# Patient Record
Sex: Female | Born: 2000 | Race: White | Hispanic: No | Marital: Single | Smoking: Never smoker
Health system: Southern US, Community
[De-identification: ages and names within clinical notes are randomized; demographics above are authoritative.]

## PROBLEM LIST (undated history)

## (undated) DIAGNOSIS — F419 Anxiety disorder, unspecified: Secondary | ICD-10-CM

## (undated) DIAGNOSIS — K589 Irritable bowel syndrome without diarrhea: Secondary | ICD-10-CM

## (undated) HISTORY — DX: Irritable bowel syndrome, unspecified: K58.9

## (undated) HISTORY — DX: Anxiety disorder, unspecified: F41.9

---
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
## 2018-02-06 ENCOUNTER — Ambulatory Visit: Payer: BLUE CROSS/BLUE SHIELD | Attending: Obstetrics & Gynecology

## 2018-02-06 DIAGNOSIS — M2170 Unequal limb length (acquired), unspecified site: Secondary | ICD-10-CM | POA: Diagnosis present

## 2018-02-06 DIAGNOSIS — M533 Sacrococcygeal disorders, not elsewhere classified: Secondary | ICD-10-CM | POA: Diagnosis present

## 2018-02-06 DIAGNOSIS — M791 Myalgia, unspecified site: Secondary | ICD-10-CM | POA: Insufficient documentation

## 2018-02-06 DIAGNOSIS — R293 Abnormal posture: Secondary | ICD-10-CM | POA: Diagnosis present

## 2018-02-06 NOTE — Therapy (Signed)
[facility] MAIN [facility] SERVICES [address] [place], [place], [address] Phone: [phone]   Fax:  [phone]  Physical Therapy Evaluation  Patient Details  Name: Judy Williamson MRN: [id number] Date of Birth: May 31, 2000 Referring Provider (PT): Magdy Milad   Encounter Date: 02/06/2018  PT End of Session - 02/09/18 0826    Visit Number  1    Number of Visits  10    Date for PT Re-Evaluation  04/20/18    PT Start Time  1630    PT Stop Time  1730    PT Time Calculation (min)  60 min    Activity Tolerance  Patient tolerated treatment well    Behavior During Therapy  [facility] for tasks assessed/performed       Past Medical History:  Diagnosis Date  . Anxiety     Past Surgical History:  Procedure Laterality Date  . HYMENECTOMY  11/2016    There were no vitals filed for this visit.     Pelvic Floor Physical Therapy Evaluation and Assessment  SCREENING  Falls in last 6 mo: no    Patient's communication preference:   Red Flags:  Have you had any night sweats? Yes but it may be her new housing. Unexplained weight loss? no Saddle anesthesia? no Unexplained changes in bowel or bladder habits? no  SUBJECTIVE  Patient reports: Things are going well, she is still dilating but refuses to use the biggest size. Still has pain with the 7th one but is using the 6th and 7th one.   Social/Family/Vocational History:   Full time student  Recent Procedures/Tests/Findings:  none  Obstetrical History: none  Gynecological History: none  Urinary History: Not having frequency  Gastrointestinal History: Having diarrhea close to daily from use of linzess and coffee, etc.  Sexual activity/pain: She is having pain with intercourse, both with initial penetration and deep thrusting.   Location of pain: vaginally with intercourse Current pain:  0/10  Max pain:  8/10 Least pain:  0/10 Nature of pain: achy  Patient Goals: To be able  to have intercourse without pain.    OBJECTIVE  Posture/Observations:  Sitting:  Standing:  R PSIS and ASIS high in standing  Palpation/Segmental Motion/Joint Play: Decreased sacral mobility on L>R and through the lumbar spine. TTP through B lumbar paraspinals and R>L QL, B hip-flexors and proximal adductiors.  Special tests:   Leg-length: R LE 1 cm long Supine-to-sit: R long in both   Range of Motion/Flexibilty: Deferred to next visit Spine: Hips:   Strength/MMT: deferred to next visit. LE MMT  LE MMT Left Right  Hip flex:  (L2) /5 /5  Hip ext: /5 /5  Hip abd: /5 /5  Hip add: /5 /5  Hip IR /5 /5  Hip ER /5 /5     Abdominal:  Palpation: TTP through B Iliacus and Psoas Diastasis: none  Pelvic Floor External Exam: Deferred to next visit. Introitus Appears:  Skin integrity:  Palpation: Cough: Prolapse visible?: Scar mobility:  Internal Vaginal Exam: Deferred to next visit Strength (PERF):  Symmetry: Palpation: Prolapse:   Gait Analysis: Pt. Is not dragging her feet like she did during prior therapy bout. Is implementing recommendations    Pelvic Floor Outcome Measures:   INTERVENTIONS THIS SESSION: Manual: Performed TP release to R psoas, QL, and lumbar paraspinals followed by R up-slip correction to improve pelvic alignment and decrease pressure on lumbosacral nerve roots. Dry-needle: Performed TPDN with a .30x13mm needle and standard approach to  R QL and lumbar paraspinals to decrease pain and spasm and allow for successful up-slip correction to improve pelvic alignment. Therex: Educated on and practiced frog stretch and side-stretch to decrease imbalance of musculature surrounding the pelvis to allow for relaxation of PFM.  Total time: 60 min.     [facility] PT Assessment - 02/09/18 0001      Assessment   Medical Diagnosis  Pelvic Floor Dysfunction    Referring Provider (PT)  Magdy Milad    Prior Therapy  Yes      Precautions   Precautions  None       Restrictions   Weight Bearing Restrictions  No      Balance Screen   Has the patient fallen in the past 6 months  No      Home Environment   Living Environment  Other (Comment)    Additional Comments  --   Dorm     Prior Function   Level of Independence  Independent    Vocation  Student      Cognition   Overall Cognitive Status  Within Functional Limits for tasks assessed                Objective measurements completed on examination: See above findings.              PT Education - 02/09/18 0825    Education provided  Yes    Education Details  See Pt. Instructions and Interventions this session    Person(s) Educated  Patient    Methods  Explanation;Verbal cues    Comprehension  Verbalized understanding;Returned demonstration       PT Short Term Goals - 02/09/18 0835      PT SHORT TERM GOAL #1   Title  Patient will demonstrate a coordinated contraction, relaxation, and bulge of the pelvic floor muscles to demonstrate functional recruitment and motion and allow for further strengthening.    Baseline  high tone and spasms not allowing appropriate bulge of PFM on cue    Time  5    Period  Weeks    Status  New    Target Date  03/16/18      PT SHORT TERM GOAL #2   Title  Pt. will demonstrate ability to have regular BM's at least every 2-3 days without use of linzess or with decreased dose as reccommended by MD.    Baseline  chronic constipation now consistently diarrhea with medication use    Time  5    Period  Weeks    Status  New    Target Date  03/16/18        PT Long Term Goals - 02/09/18 0841      PT LONG TERM GOAL #1   Title  Patient will report no pain with intercourse to demonstrate improved functional ability.    Baseline  Pain greater with digital than with penile penetration but present with moth.    Time  10    Period  Weeks    Status  New    Target Date  04/20/18      PT LONG TERM GOAL #2   Title  Patient will report  having BM's at least every-other day with consistency between [facility] stool scale 3-5 over the prior week without use of medication to regulate to demonstrate decreased constipation.    Time  10    Period  Weeks    Status  New    Target Date  04/20/18  Plan - 02/09/18 0826    Clinical Impression Statement  Pt. is a 17 y/o female who presents today with cheif c/o pain with intercourse. She was previously recieving therapy for pelvic pain, low back pain and inability to use a tampon, all of which have maintained improvement but she continues to have pain with digital penetration and intercourse. Her relevant PMH includes a hymnectomy, IBS, and anxiety. Cher clinical exam revealed minor R innominate up-slip and decreased mobility, spasms through B lumbar paraspinals and R QL and decreased mobility of R>L sacral borders. She will benefif from skilled pelvic PT to help her continue to decrease pelvic floor spasms and increase postural strength to prevent return of spasms and allow for vaginal penetration without pain as well as to help Pt. maintain normal bowel function as she weans off of linzess to decrease reliance on medication for bowel function.     History and Personal Factors relevant to plan of care:  High anxiety, IBS    Clinical Presentation  Stable    Clinical Decision Making  Moderate    Rehab Potential  Good    PT Frequency  1x / week    PT Duration  --   10 weeks   PT Treatment/Interventions  Therapeutic activities;Functional mobility training;Neuromuscular re-education;Therapeutic exercise;Patient/family education;Moist Heat;Manual lymph drainage;Manual techniques;Scar mobilization    PT Next Visit Plan  Vaginal assessment and TP release, rebuild HEP in new application    PT Home Exercise Plan   foam roll to adductors and quads, hip flexor stretch, diaphragmatic breathing, TA mini-marches, chin-tucks, scapular retraction/depression,thoracic extension over foam  roller/towel, tall kneeling, forward stepping balance, froggy stretch hold/relax stretch, foam roller thoracic mobility sequence, self internal TP release and posterior fourchette massage, side-stepping.    Consulted and Agree with Plan of Care  Patient       Patient will benefit from skilled therapeutic intervention in order to improve the following deficits and impairments:  Decreased activity tolerance, Decreased coordination, Decreased safety awareness, Decreased strength, Increased fascial restricitons, Postural dysfunction, Pain, Improper body mechanics, Decreased range of motion, Decreased scar mobility, Decreased endurance, Decreased balance, Decreased mobility, Increased muscle spasms, Hypomobility, Difficulty walking  Visit Diagnosis: Myalgia  Sacrococcygeal disorders, not elsewhere classified  Leg length discrepancy  Abnormal posture     Problem List Patient Active Problem List   Diagnosis Date Noted  . Anxiety 02/21/2017   Cleophus Molt DPT, ATC Cleophus Molt 02/09/2018, 8:59 AM  [facility] MAIN [facility] SERVICES [address] [place], [place], [address] Phone: [phone]   Fax:  [phone]  Name: Shakinah Navis MRN: [id number] Date of Birth: Dec 17, 2000

## 2018-02-07 NOTE — Patient Instructions (Signed)
Access Code: [email or web address]  URL: [email or web address]  Date: 02/06/2018  Prepared by: Flora Lipps   Exercises  Seated Alternating Side Stretch with Arm Overhead - 3 reps - 5 breaths hold - 1x daily - 7x weekly  Quadruped Adductor Stretch - 3 reps - 5 breaths hold - 1x daily - 7x weekly

## 2018-02-09 ENCOUNTER — Other Ambulatory Visit: Payer: Self-pay

## 2018-02-21 ENCOUNTER — Ambulatory Visit: Payer: BLUE CROSS/BLUE SHIELD | Attending: Obstetrics & Gynecology

## 2018-02-21 DIAGNOSIS — M533 Sacrococcygeal disorders, not elsewhere classified: Secondary | ICD-10-CM | POA: Insufficient documentation

## 2018-02-21 DIAGNOSIS — M2170 Unequal limb length (acquired), unspecified site: Secondary | ICD-10-CM | POA: Insufficient documentation

## 2018-02-21 DIAGNOSIS — R293 Abnormal posture: Secondary | ICD-10-CM | POA: Insufficient documentation

## 2018-02-21 DIAGNOSIS — M791 Myalgia, unspecified site: Secondary | ICD-10-CM | POA: Diagnosis present

## 2018-02-21 NOTE — Therapy (Signed)
[facility] MAIN [facility] SERVICES [address] [place], [place], [address] Phone: [phone]   Fax:  [phone]  Physical Therapy Treatment  Patient Details  Name: Judy Williamson MRN: [id number] Date of Birth: [birth year] Referring Provider (PT): Magdy Milad   Encounter Date: 02/21/2018  PT End of Session - 02/22/18 1637    Visit Number  2    Number of Visits  10    Date for PT Re-Evaluation  04/20/18    PT Start Time  1630    PT Stop Time  1735    PT Time Calculation (min)  65 min    Activity Tolerance  Patient tolerated treatment well    Behavior During Therapy  [facility] for tasks assessed/performed       Past Medical History:  Diagnosis Date  . Anxiety     Past Surgical History:  Procedure Laterality Date  . HYMENECTOMY  11/2016    There were no vitals filed for this visit.    Pelvic Floor Physical Therapy Treatment Note  SCREENING  Changes in medications, allergies, or medical history?: no    SUBJECTIVE  Patient reports: Has only done her stretches/exercises once since last visit but has no pain today.   Pain update: No pain since last visit.  Location of pain: vaginally with intercourse Current pain: 0/10  Max pain: 8/10 Least pain: 0/10 Nature of pain:achy  Patient Goals: To be able to have intercourse without pain.   OBJECTIVE  Changes in: Posture/Observations:  PSIS and ASIS are aligned at beginning of session.   Pelvic floor: TTP through all muscles on the R with resolution and to posterior PR/PC on L   INTERVENTIONS THIS SESSION: Manual: performed internal TP release to all muscles on the R with resolution and to posterior PR/PC on L with > 50% improvement to decrease pain and spasm with vaginal penetration. Self-care: recommended to Pt. That she contact her GI doctor to trial decrease  In linzess due to her consistent diarrhea and to see how her body has improved in its ability to empty from  therapy.   Total time: 60 min.                          PT Education - 02/22/18 1637    Education provided  Yes    Education Details  See Interventions this session    Person(s) Educated  Patient    Methods  Explanation    Comprehension  Verbalized understanding       PT Short Term Goals - 02/09/18 0835      PT SHORT TERM GOAL #1   Title  Patient will demonstrate a coordinated contraction, relaxation, and bulge of the pelvic floor muscles to demonstrate functional recruitment and motion and allow for further strengthening.    Baseline  high tone and spasms not allowing appropriate bulge of PFM on cue    Time  5    Period  Weeks    Status  New    Target Date  03/16/18      PT SHORT TERM GOAL #2   Title  Pt. will demonstrate ability to have regular BM's at least every 2-3 days without use of linzess or with decreased dose as reccommended by MD.    Baseline  chronic constipation now consistently diarrhea with medication use    Time  5    Period  Weeks    Status  New  Target Date  03/16/18        PT Long Term Goals - 02/09/18 0841      PT LONG TERM GOAL #1   Title  Patient will report no pain with intercourse to demonstrate improved functional ability.    Baseline  Pain greater with digital than with penile penetration but present with moth.    Time  10    Period  Weeks    Status  New    Target Date  04/20/18      PT LONG TERM GOAL #2   Title  Patient will report having BM's at least every-other day with consistency between [facility] stool scale 3-5 over the prior week without use of medication to regulate to demonstrate decreased constipation.    Time  10    Period  Weeks    Status  New    Target Date  04/20/18            Plan - 02/22/18 1638    Clinical Impression Statement  Pt. tolerated internal treatment far better than during her previous round of therapy and demonstrated significant improvement in her ability to release muscular  tension in the PFM by achieving resolution of spasms throygh the entire right side of the PFM and some on the R. Continue per POC.    Clinical Presentation  Stable    Clinical Decision Making  Moderate    Rehab Potential  Good    PT Frequency  1x / week    PT Duration  --   10 weeks   PT Treatment/Interventions  Therapeutic activities;Functional mobility training;Neuromuscular re-education;Therapeutic exercise;Patient/family education;Moist Heat;Manual lymph drainage;Manual techniques;Scar mobilization    PT Next Visit Plan  Internal TP release to L, continue to rebuild HEP in medbridge    PT Home Exercise Plan   foam roll to adductors and quads, hip flexor stretch, diaphragmatic breathing, TA mini-marches, chin-tucks, scapular retraction/depression,thoracic extension over foam roller/towel, tall kneeling, forward stepping balance, froggy stretch hold/relax stretch, foam roller thoracic mobility sequence, self internal TP release and posterior fourchette massage, side-stepping.    Consulted and Agree with Plan of Care  Patient       Patient will benefit from skilled therapeutic intervention in order to improve the following deficits and impairments:  Decreased activity tolerance, Decreased coordination, Decreased safety awareness, Decreased strength, Increased fascial restricitons, Postural dysfunction, Pain, Improper body mechanics, Decreased range of motion, Decreased scar mobility, Decreased endurance, Decreased balance, Decreased mobility, Increased muscle spasms, Hypomobility, Difficulty walking  Visit Diagnosis: Myalgia  Sacrococcygeal disorders, not elsewhere classified  Leg length discrepancy  Abnormal posture     Problem List Patient Active Problem List   Diagnosis Date Noted  . Anxiety 02/21/2017    Cleophus Molt 02/22/2018, 4:42 PM  [place] [facility] MAIN [facility] SERVICES [address] [place], [place], [address] Phone: [phone]   Fax:   [phone]  Name: Judy Williamson MRN: [id number] Date of Birth: 2000-05-01

## 2018-03-02 ENCOUNTER — Emergency Department
Admission: EM | Admit: 2018-03-02 | Discharge: 2018-03-02 | Disposition: A | Discharge status: Home / Self Care | Payer: BLUE CROSS/BLUE SHIELD | Attending: Emergency Medicine | Admitting: Emergency Medicine

## 2018-03-02 ENCOUNTER — Other Ambulatory Visit: Payer: Self-pay

## 2018-03-02 ENCOUNTER — Encounter: Payer: Self-pay | Admitting: Emergency Medicine

## 2018-03-02 DIAGNOSIS — R112 Nausea with vomiting, unspecified: Secondary | ICD-10-CM

## 2018-03-02 DIAGNOSIS — E860 Dehydration: Secondary | ICD-10-CM

## 2018-03-02 DIAGNOSIS — Z79899 Other long term (current) drug therapy: Secondary | ICD-10-CM | POA: Insufficient documentation

## 2018-03-02 MED ORDER — ONDANSETRON HCL 4 MG/2ML IJ SOLN
4.0000 mg | Freq: Once | INTRAMUSCULAR | Status: AC
  Administered 2018-03-02: 4 mg via INTRAVENOUS
  Filled 2018-03-02: qty 2

## 2018-03-02 MED ORDER — DEXTROSE 5 % AND 0.45 % NACL IV BOLUS
1000.0000 mL | Freq: Once | INTRAVENOUS | Status: AC
  Administered 2018-03-02: 1000 mL via INTRAVENOUS

## 2018-03-02 NOTE — ED Triage Notes (Signed)
Pt states "I think I have alcohol poisoning".  Pt reporst drinking 6 shots and 1 beer last night and has been vomiting since 2 am.  No pain. Has had a little diarrhea. Ambulatory. VSS. NAD.

## 2018-03-02 NOTE — ED Notes (Signed)
Pt reports drinking large amt of alcohol last night, continues to vomit off and on since 2 am. Pt states she is vomiting bile.

## 2018-03-02 NOTE — ED Notes (Signed)
Pt given water and crackers, pt reports she is feeling better.

## 2018-03-02 NOTE — ED Provider Notes (Signed)
[facility] Emergency Department Provider Note  ____________________________________________   First MD Initiated Contact with Patient 03/02/18 [phone]     (approximate)  I have reviewed the triage vital signs and the nursing notes.   HISTORY  Chief Complaint Emesis   HPI Judy Williamson is a 17 y.o. female with a history of anxiety was presented to the emergency department with nausea vomiting and diarrhea.  The patient reports that she had 6 shots of alcohol last night and then had several beers.  Says that she stopped drinking approximately 9 PM and then started vomiting at about 1:59 AM.  Denies any abdominal pain at this time.  Does not report any blood in her vomitus or diarrhea.  Says that she is only had a small amount of diarrhea. Patient says that she also smoked marijuana.  No other substances ingested.  Past Medical History:  Diagnosis Date  . Anxiety     Patient Active Problem List   Diagnosis Date Noted  . Anxiety 02/21/2017    Past Surgical History:  Procedure Laterality Date  . HYMENECTOMY  11/2016    Prior to Admission medications   Medication Sig Start Date End Date Taking? Authorizing Provider  azelastine (ASTELIN) 0.1 % nasal spray Place 2 sprays into both nostrils 2 (two) times daily. Use in each nostril as directed    [provider]  DiphenhydrAMINE HCl (ALLERGY MED PO) Take by mouth.    [provider]  escitalopram (LEXAPRO) 5 MG tablet Take 5 mg daily by mouth.    [provider]  fluticasone (FLONASE) 50 MCG/ACT nasal spray Place 2 sprays into both nostrils 2 (two) times daily.    [provider]  levocetirizine (XYZAL) 5 MG tablet Take 5 mg by mouth every evening.    [provider]  levonorgestrel-ethinyl estradiol (NORDETTE) 0.15-30 MG-MCG tablet Take 1 tablet daily by mouth.    [provider]  linaclotide (LINZESS) 290 MCG CAPS capsule Take 290 mcg daily before  breakfast by mouth.    [provider]  montelukast (SINGULAIR) 10 MG tablet Take 10 mg by mouth at bedtime.    [provider]  Probiotic Product (PROBIOTIC ADVANCED PO) Take by mouth.    [provider]    Allergies Patient has no known allergies.  Family History  Problem Relation Age of Onset  . Breast cancer Maternal Grandmother   . Diabetes Maternal Grandfather     Social History Social History   Tobacco Use  . Smoking status: Never Smoker  . Smokeless tobacco: Never Used  Substance Use Topics  . Alcohol use: Yes    Frequency: Never    Comment: social/ weekends  . Drug use: No    Review of Systems  Constitutional: No fever/chills Eyes: No visual changes. ENT: No sore throat. Cardiovascular: Denies chest pain. Respiratory: Denies shortness of breath. Gastrointestinal: No abdominal pain.  No constipation. Genitourinary: Negative for dysuria. Musculoskeletal: Negative for back pain. Skin: Negative for rash. Neurological: Negative for headaches, focal weakness or numbness.   ____________________________________________   PHYSICAL EXAM:  VITAL SIGNS: ED Triage Vitals  Enc Vitals Group     BP 03/02/18 0828 122/71     Pulse Rate 03/02/18 0828 77     Resp 03/02/18 0828 18     Temp --      Temp Source 03/02/18 0828 Oral     SpO2 03/02/18 0828 100 %     Weight 03/02/18 0827 135 lb (61.2 kg)  Height 03/02/18 0827 5' (1.524 m)     Head Circumference --      Peak Flow --      Pain Score 03/02/18 0827 0     Pain Loc --      Pain Edu? --      Excl. in GC? --     Constitutional: Alert and oriented. Well appearing and in no acute distress.  However, does occasionally have dry heaves during the interview.   Eyes: Conjunctivae are normal.  Head: Atraumatic. Nose: No congestion/rhinnorhea. Mouth/Throat: Mucous membranes are moist.  Neck: No stridor.   Cardiovascular: Normal rate, regular rhythm. Grossly normal heart sounds.    Respiratory: Normal respiratory effort.  No retractions. Lungs CTAB. Gastrointestinal: Soft and nontender. No distention.  Musculoskeletal: No lower extremity tenderness nor edema.  No joint effusions. Neurologic:  Normal speech and language. No gross focal neurologic deficits are appreciated. Skin:  Skin is warm, dry and intact. No rash noted. Psychiatric: Mood and affect are normal. Speech and behavior are normal.  ____________________________________________   LABS (all labs ordered are listed, but only abnormal results are displayed)  Labs Reviewed - No data to display ____________________________________________  EKG   ____________________________________________  RADIOLOGY   ____________________________________________   PROCEDURES  Procedure(s) performed:   Procedures  Critical Care performed:   ____________________________________________   INITIAL IMPRESSION / ASSESSMENT AND PLAN / ED COURSE  Pertinent labs & imaging results that were available during my care of the patient were reviewed by me and considered in my medical decision making (see chart for details).  DDX: Dehydration, ketosis, alcohol intoxication, diarrhea As part of my medical decision making, I reviewed the following data within the electronic MEDICAL RECORD NUMBER Notes from prior outpatient visits  ----------------------------------------- 10:00 AM on 03/02/2018 -----------------------------------------  Patient no longer with nausea and vomiting.  Tolerating p.o. fluids.  Will finish fluids to be discharged home.  She is understanding the diagnosis as well as treatment and willing to comply. ____________________________________________   FINAL CLINICAL IMPRESSION(S) / ED DIAGNOSES  Nausea and vomiting.  Dehydration.   NEW MEDICATIONS STARTED DURING THIS VISIT:  New Prescriptions   No medications on file     Note:  This document was prepared using Dragon voice recognition  software and may include unintentional dictation errors.     Myrna BlazerSchaevitz, Rickie Gutierres Matthew, MD 03/02/18 1000

## 2018-03-13 ENCOUNTER — Ambulatory Visit: Payer: BLUE CROSS/BLUE SHIELD

## 2018-03-20 ENCOUNTER — Ambulatory Visit: Payer: BLUE CROSS/BLUE SHIELD | Attending: Obstetrics & Gynecology

## 2018-03-20 DIAGNOSIS — M533 Sacrococcygeal disorders, not elsewhere classified: Secondary | ICD-10-CM | POA: Diagnosis present

## 2018-03-20 DIAGNOSIS — M2170 Unequal limb length (acquired), unspecified site: Secondary | ICD-10-CM | POA: Insufficient documentation

## 2018-03-20 DIAGNOSIS — R293 Abnormal posture: Secondary | ICD-10-CM | POA: Insufficient documentation

## 2018-03-20 DIAGNOSIS — M791 Myalgia, unspecified site: Secondary | ICD-10-CM | POA: Insufficient documentation

## 2018-03-20 NOTE — Therapy (Signed)
[place] [facility] MAIN [facility] SERVICES [address] [place], [place], [address] Phone: [phone]   Fax:  [phone]  Physical Therapy Treatment  Patient Details  Name: Judy Williamson MRN: [id number] Date of Birth: Feb 17, 2001 Referring Provider (PT): Magdy Milad   Encounter Date: 03/20/2018  PT End of Session - 03/20/18 1837    Visit Number  3    Number of Visits  10    Date for PT Re-Evaluation  04/20/18    PT Start Time  1635    PT Stop Time  1735    PT Time Calculation (min)  60 min    Activity Tolerance  Patient tolerated treatment well    Behavior During Therapy  [facility] for tasks assessed/performed       Past Medical History:  Diagnosis Date  . Anxiety     Past Surgical History:  Procedure Laterality Date  . HYMENECTOMY  11/2016    There were no vitals filed for this visit.    Pelvic Floor Physical Therapy Treatment Note  SCREENING  Changes in medications, allergies, or medical history?: Decreasing dosage of linzess   SUBJECTIVE  Patient reports: She had some back pain a couple days ago after flying all day but it feels better today, has been able to skip days with linzess and do ok. Is still having pain with intercourse.   Pain update:  Location of pain: low back Current pain:  0/10  Max pain:  5/10 Least pain:  0/10 Nature of pain: achy  Patient Goals: To be able to have intercourse without pain.   OBJECTIVE  Changes in: Pelvic floor: TTP through all PFM on L  INTERVENTIONS THIS SESSION: Manual: Performed TP release to L PFM throughout with complete release of all except for posterior PR/PC on L which reduced by ~ 75%.  Total time: 60 min.                           PT Short Term Goals - 02/09/18 8756      PT SHORT TERM GOAL #1   Title  Patient will demonstrate a coordinated contraction, relaxation, and bulge of the pelvic floor muscles to demonstrate functional recruitment and  motion and allow for further strengthening.    Baseline  high tone and spasms not allowing appropriate bulge of PFM on cue    Time  5    Period  Weeks    Status  New    Target Date  03/16/18      PT SHORT TERM GOAL #2   Title  Pt. will demonstrate ability to have regular BM's at least every 2-3 days without use of linzess or with decreased dose as reccommended by MD.    Baseline  chronic constipation now consistently diarrhea with medication use    Time  5    Period  Weeks    Status  New    Target Date  03/16/18        PT Long Term Goals - 02/09/18 0841      PT LONG TERM GOAL #1   Title  Patient will report no pain with intercourse to demonstrate improved functional ability.    Baseline  Pain greater with digital than with penile penetration but present with moth.    Time  10    Period  Weeks    Status  New    Target Date  04/20/18      PT  LONG TERM GOAL #2   Title  Patient will report having BM's at least every-other day with consistency between [facility] scale 3-5 over the prior week without use of medication to regulate to demonstrate decreased constipation.    Time  10    Period  Weeks    Status  New    Target Date  04/20/18            Plan - 03/20/18 1837    Clinical Impression Statement  Pt. responded slowly, but well to internal TP release today, demonstrating decreased spasm in all PFM on the L with only ~ 25% remaining in L posterior PR/PC following treatment. Continue per POC.    Clinical Presentation  Stable    Clinical Decision Making  Moderate    Rehab Potential  Good    PT Frequency  1x / week    PT Duration  --   10 weeks   PT Treatment/Interventions  Therapeutic activities;Functional mobility training;Neuromuscular re-education;Therapeutic exercise;Patient/family education;Moist Heat;Manual lymph drainage;Manual techniques;Scar mobilization    PT Next Visit Plan  Internal TP release starting at L PR/PC, determine if fascial restriction at medial  ischial tub that needs to be addressed, assess sacral mobility and if TP are present at glute med and Piriformis/DN if needed. continue to rebuild HEP in medbridge    PT Home Exercise Plan   foam roll to adductors and quads, hip flexor stretch, diaphragmatic breathing, TA mini-marches, chin-tucks, scapular retraction/depression,thoracic extension over foam roller/towel, tall kneeling, forward stepping balance, froggy stretch hold/relax stretch, foam roller thoracic mobility sequence, self internal TP release and posterior fourchette massage, side-stepping.    Consulted and Agree with Plan of Care  Patient       Patient will benefit from skilled therapeutic intervention in order to improve the following deficits and impairments:  Decreased activity tolerance, Decreased coordination, Decreased safety awareness, Decreased strength, Increased fascial restricitons, Postural dysfunction, Pain, Improper body mechanics, Decreased range of motion, Decreased scar mobility, Decreased endurance, Decreased balance, Decreased mobility, Increased muscle spasms, Hypomobility, Difficulty walking  Visit Diagnosis: Myalgia  Sacrococcygeal disorders, not elsewhere classified  Leg length discrepancy  Abnormal posture     Problem List Patient Active Problem List   Diagnosis Date Noted  . Anxiety 02/21/2017   Cleophus MoltKeeli T. Devora Tortorella DPT, ATC Cleophus MoltKeeli T Cassey Bacigalupo 03/20/2018, 6:42 PM  [place] [facility] [address] [place], [place], [address] Phone: [phone]   Fax:  [phone]  Name: Judy Williamson MRN: [id number] Date of Birth: 30-May-2000

## 2018-03-26 ENCOUNTER — Ambulatory Visit: Payer: BLUE CROSS/BLUE SHIELD

## 2018-03-26 DIAGNOSIS — R293 Abnormal posture: Secondary | ICD-10-CM

## 2018-03-26 DIAGNOSIS — M2170 Unequal limb length (acquired), unspecified site: Secondary | ICD-10-CM

## 2018-03-26 DIAGNOSIS — M791 Myalgia, unspecified site: Secondary | ICD-10-CM

## 2018-03-26 DIAGNOSIS — M533 Sacrococcygeal disorders, not elsewhere classified: Secondary | ICD-10-CM

## 2018-03-26 NOTE — Patient Instructions (Signed)
Access Code: [email or web address]  URL: [email or web address]  Date: 03/26/2018  Prepared by: Flora LippsKeeli Gailes   Exercises  Sidelying Thoracic and Shoulder Rotation - 10 reps - 3 sets - 1x daily - 7x weekly  Half Kneeling Hip Flexor Stretch - 3 reps - 5 breaths hold - 1x daily - 7x weekly  Seated Flexion Stretch - 3 reps - 5 breaths hold - 1x daily - 7x weekly  TL Sidebending Stretch - Single Arm Overhead - 3 reps - 5 breaths hold - 1x daily - 7x weekly  Quadruped Pelvic Floor Contraction with Opposite Arm and Leg Lift - 10 reps - 3 sets - 1x daily - 7x weekly  Thoracic Extension Mobilization on Foam Roll - 10 reps - 3 sets - 1x daily - 7x weekly  Squatting Shoulder Row with Anchored Resistance - 10 reps - 3 sets - 1x daily - 7x weekly  Quadruped Adductor Stretch - 3 reps - 5 breaths hold - 1x daily - 7x weekly  Diaphragmatic Breathing in Child's Pose with Pelvic Floor Relaxation - 3 reps - 5 breaths hold - 1x daily - 7x weekly  Single Leg Balance with Opposite Leg Star Reach - 5 reps - 3 sets - 1 second hold - 1x daily - 7x weekly

## 2018-03-26 NOTE — Therapy (Signed)
[place] [facility] MAIN [facility] SERVICES [address] [place], [place], [address] Phone: [phone]   Fax:  [phone]  Physical Therapy Treatment  Patient Details  Name: Judy Williamson MRN: [id number] Date of Birth: 05-06-2000 Referring Provider (PT): Magdy Milad   Encounter Date: 03/26/2018  PT End of Session - 03/27/18 1010    Visit Number  4    Number of Visits  10    Date for PT Re-Evaluation  04/20/18    PT Start Time  1505    PT Stop Time  1605    PT Time Calculation (min)  60 min    Activity Tolerance  Patient tolerated treatment well    Behavior During Therapy  [facility] for tasks assessed/performed       Past Medical History:  Diagnosis Date  . Anxiety     Past Surgical History:  Procedure Laterality Date  . HYMENECTOMY  11/2016    There were no vitals filed for this visit.   Pelvic Floor Physical Therapy Treatment Note  SCREENING  Changes in medications, allergies, or medical history?: has gone down on her linzess and has forgotten to take it and not had a problem.    SUBJECTIVE  Patient reports: Thinks she is having a BM every day even when not taking her linzess. Had pain for one day but it went away, wore her posture corrector for three hours the ext day.  Pain update:  Location of pain: R low baks Current pain:  0/10  Max pain:  7/10 Least pain:  0/10 Nature of pain: achy  Patient Goals: To be able to have intercourse without pain.   OBJECTIVE  Changes in: Posture/Observations:  Slightly more superficial L sacral base   Range of Motion/Flexibilty:  Slightly decreased mobility and tenderness through sacrum and lumbar spine.  Palpation: TTP through B regions lateral to coxxyx.  INTERVENTIONS THIS SESSION: Manual: Performed MFR lateral to B borders of the coxxyx  And PA mobs to L sacral base as well as through lumbar vertebrae to continue to decrease any tension acting on the pudendal nerve and other  lumbosacral nerves to allow for further reduction in pain and spasms to allow for sustained relief of PFM spasms and decrease pain with intercourse. Therex: Reviewed HEP and provided Pt. 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  Total time: 60 min.                           PT Education - 03/27/18 1009    Education provided  Yes    Education Details  See Pt. Instructions and Interventions this session.    Person(s) Educated  Patient    Methods  Explanation;Demonstration;Verbal cues;Handout;Tactile cues    Comprehension  Verbalized understanding;Returned demonstration;Verbal cues required;Tactile cues required       PT Short Term Goals - 03/27/18 1011      PT SHORT TERM GOAL #1   Title  Patient will demonstrate a coordinated contraction, relaxation, and bulge of the pelvic floor muscles to demonstrate functional recruitment and motion and allow for further strengthening.    Baseline  high tone and spasms not allowing appropriate bulge of PFM on cue    Time  5    Period  Weeks    Status  Achieved  Target Date  03/16/18      PT SHORT TERM GOAL #2   Title  Pt. will demonstrate ability to have regular BM's at least every 2-3 days without use of linzess or with decreased dose as reccommended by MD.    Baseline  chronic constipation now consistently diarrhea with medication use    Time  5    Period  Weeks    Status  Achieved    Target Date  03/16/18        PT Long Term Goals - 02/09/18 0841      PT LONG TERM GOAL #1   Title  Patient will report no pain with intercourse to demonstrate improved functional ability.    Baseline  Pain greater with digital than with penile penetration but present with moth.    Time  10    Period  Weeks    Status  New    Target Date  04/20/18      PT LONG TERM GOAL  #2   Title  Patient will report having BM's at least every-other day with consistency between [facility] stool scale 3-5 over the prior week without use of medication to regulate to demonstrate decreased constipation.    Time  10    Period  Weeks    Status  New    Target Date  04/20/18            Plan - 03/27/18 1011    Clinical Impression Statement  Pt. responded well to all interventions today and demonstrates greatly improved ability to have a BM with reduced dosage of medication, even missing doses on occasion without detrimental effect. She continues to have some pain with intercourse and will continue to benefif from skilled PT when she returns from her Christmas break to make sure that she has continued to improve through use of her HEP and increase strengthening to prevent return of poor alignment and spasms. Continue per POC.    Clinical Presentation  Stable    Clinical Decision Making  Moderate    Rehab Potential  Good    PT Frequency  1x / week    PT Duration  --   10 weeks   PT Treatment/Interventions  Therapeutic activities;Functional mobility training;Neuromuscular re-education;Therapeutic exercise;Patient/family education;Moist Heat;Manual lymph drainage;Manual techniques;Scar mobilization    PT Next Visit Plan  re-assess PFm and perform Internal TP release PRN. up-grade deep-core strengthening.    PT Home Exercise Plan   foam roll to adductors and quads, hip flexor stretch, diaphragmatic breathing, TA mini-marches, chin-tucks, scapular retraction/depression,thoracic extension over foam roller/towel, tall kneeling, forward stepping balance, froggy stretch hold/relax stretch, foam roller thoracic mobility sequence, self internal TP release and posterior fourchette massage, side-stepping.    Consulted and Agree with Plan of Care  Patient       Patient will benefit from skilled therapeutic intervention in order to improve the following deficits and impairments:  Decreased  activity tolerance, Decreased coordination, Decreased safety awareness, Decreased strength, Increased fascial restricitons, Postural dysfunction, Pain, Improper body mechanics, Decreased range of motion, Decreased scar mobility, Decreased endurance, Decreased balance, Decreased mobility, Increased muscle spasms, Hypomobility, Difficulty walking  Visit Diagnosis: Myalgia  Sacrococcygeal disorders, not elsewhere classified  Leg length discrepancy  Abnormal posture     Problem List Patient Active Problem List   Diagnosis Date Noted  . Anxiety 02/21/2017   Cleophus MoltKeeli T. Gerardo Territo DPT, ATC Cleophus MoltKeeli T Ceairra Mccarver 03/27/2018, 10:16 AM  [place] [facility] MAIN REHAB SERVICES [address]  [place], [place], [address] Phone: [phone]   Fax:  [phone]  Name: Judy Williamson MRN: [id number] Date of Birth: [birth year]

## 2018-05-28 ENCOUNTER — Ambulatory Visit: Payer: BLUE CROSS/BLUE SHIELD | Attending: Obstetrics & Gynecology

## 2018-05-28 DIAGNOSIS — R293 Abnormal posture: Secondary | ICD-10-CM | POA: Diagnosis present

## 2018-05-28 DIAGNOSIS — M791 Myalgia, unspecified site: Secondary | ICD-10-CM

## 2018-05-28 DIAGNOSIS — M533 Sacrococcygeal disorders, not elsewhere classified: Secondary | ICD-10-CM

## 2018-05-28 DIAGNOSIS — M2170 Unequal limb length (acquired), unspecified site: Secondary | ICD-10-CM | POA: Diagnosis present

## 2018-05-28 NOTE — Therapy (Signed)
[place] [facility] MAIN [facility] SERVICES [address] [place], [place], [address] Phone: [phone]   Fax:  [phone]  Physical Therapy Re-assessment Note     Patient Details  Name: Judy Williamson MRN: [id number] Date of Birth: [birth year] Referring Provider (PT): Magdy Milad   Encounter Date: 05/28/2018  PT End of Session - 05/29/18 0830    Visit Number  5    Number of Visits  10    Date for PT Re-Evaluation  07/24/18    Authorization - Visit Number  1    Authorization - Number of Visits  8    PT Start Time  1635    PT Stop Time  1735    PT Time Calculation (min)  60 min    Activity Tolerance  Patient tolerated treatment well    Behavior During Therapy  [facility] for tasks assessed/performed       Past Medical History:  Diagnosis Date  . Anxiety     Past Surgical History:  Procedure Laterality Date  . HYMENECTOMY  11/2016    There were no vitals filed for this visit.    Pelvic Floor Physical Therapy Re-assessment Note  SCREENING  Changes in medications, allergies, or medical history?: Has stopped taking linzess   SUBJECTIVE  Patient reports: Has not dilated lately due to travel, being on her period, etc. Is just having "bad cramps" from period, no LBP otherwise. Has not tried intercourse lately. Has been able to have BM's 1-2 times per day without straining. Has been able to use tampons though they are sometimes uncomfortable and she can feel them try to push out if she coughs or sneezes a lot.     Pain update:  Location of pain: low back and lower abdomen (cramps) Current pain:  0/10  Max pain:  8/10 Least pain:  0/10 Nature of pain: achy (period cramps)  Patient Goals: To be able to have intercourse without pain.    OBJECTIVE  Changes in: Posture/Observations:  L hip lower than R in standing, Pt. Using inadequate heel-lift.  Range of Motion/Flexibilty:  Decreased mobility of ~T7-8 and L1-L2 segments with  tenderness upon pressure.  Abdominal:  Pt. Demonstrates rib-flare and difficulty recruiting TA   Palpation: TTP through L hip-flexors and B multifidus near T-L junction as well as L paraspinals near T-L junction.   INTERVENTIONS THIS SESSION: Self-care: Pt. Educated on using appropriate heel-lift to achieve adequate elevation and take pressure off of her hips and back for further spasm reduction.  Therex: educated on and practiced breathing with TA and oblique activation in hook-lying as well as standing against wall with added chest press with green band to engage obliques and minimize rib flare and hyperlordosis. Manual: Performed TP release and STM to L hip-flexors and B multifidus near T-L junction as well as L paraspinals near T-L junction to decrease pain and spasm and allow for improved ability to recruit deep-core musculature for stability and to decrease pressure on nerve roots that innervate the abdomen and pelvis. Dry needle: Performed TPDN to B multifidus near T-L junction as well as L paraspinals near T-L junction  to decrease pain and spasm and allow for improved ability to recruit deep-core musculature for stability and to decrease pressure on nerve roots that innervate the abdomen and pelvis.  Total time: 60 min.                          PT Education -  05/29/18 0829    Education provided  Yes    Education Details  See Interventions this session    Person(s) Educated  Patient    Methods  Explanation;Demonstration;Verbal cues;Tactile cues;Handout    Comprehension  Verbalized understanding;Returned demonstration;Verbal cues required;Tactile cues required       PT Short Term Goals - 05/29/18 0857      PT SHORT TERM GOAL #1   Title  Patient will demonstrate a coordinated contraction, relaxation, and bulge of the pelvic floor muscles to demonstrate functional recruitment and motion and allow for further strengthening.    Baseline  high tone and spasms  not allowing appropriate bulge of PFM on cue    Time  5    Period  Weeks    Status  Achieved    Target Date  03/16/18      PT SHORT TERM GOAL #2   Title  Pt. will demonstrate ability to have regular BM's at least every 2-3 days without use of linzess or with decreased dose as reccommended by MD.    Baseline  chronic constipation now consistently diarrhea with medication use    Time  5    Period  Weeks    Status  Achieved    Target Date  03/16/18      PT SHORT TERM GOAL #3   Title  Pt. will demonstrate reduced rib-flare and activation of deep-core musculature with functional activity and exercise to allow for further symptom reduction.    Baseline  Decreased rib mobility, difficulty recruiting deep-core and rib flare/hyperlordosis.    Time  6    Period  Weeks    Status  New    Target Date  07/10/18        PT Long Term Goals - 05/29/18 0845      PT LONG TERM GOAL #1   Title  Patient will report no pain with intercourse to demonstrate improved functional ability.    Baseline  Pain greater with digital than with penile penetration but present with moth.    Time  10    Period  Weeks    Status  New    Target Date  07/24/18      PT LONG TERM GOAL #2   Title  Patient will report having BM's at least every-other day with consistency between [facility] stool scale 3-5 over the prior week without use of medication to regulate to demonstrate decreased constipation.    Time  10    Period  Weeks    Status  New    Target Date  07/24/18            Plan - 05/29/18 0830    Clinical Impression Statement  Pt. Has been able to mostly maintain progress while on her trip and has been able to come off of her linzess while maintaining 1-2x/day BMs as well as no return of back pain. She has not attempted intercourse or dilation recently and did not want to do internal exam today due to mentruating but this will be assessed at following visit. She continues to demonstrate hyperlordosis and  rib-flare as well as decreased deep-core activation that will continue to drive her toward PFM tension and pain with intercourse if not fully addressed. She responded well to all interventions today, demonstrating understanding of the need to attain an appropriate heel-lift and work on increasing her core stability as well as decreased spasms following manual treatment to allow for further improvement in pain with intercourse. She  will benefit from 8 more sessions at 1x/week to continue building on progress and work toward achieving her long-term goals.    History and Personal Factors relevant to plan of care:  High anxiety    Clinical Presentation  Stable    Clinical Decision Making  Moderate    Rehab Potential  Good    Clinical Impairments Affecting Rehab Potential  anxiety around the concept of vaginal penetration, fear of rape    PT Frequency  1x / week    PT Duration  8 weeks   10 weeks   PT Treatment/Interventions  Therapeutic activities;Functional mobility training;Neuromuscular re-education;Therapeutic exercise;Patient/family education;Moist Heat;Manual lymph drainage;Manual techniques;Scar mobilization    PT Next Visit Plan  re-assess PFm and perform Internal TP release PRN. up-grade deep-core strengthening.    PT Home Exercise Plan   foam roll to adductors and quads, hip flexor stretch, diaphragmatic breathing, TA mini-marches, chin-tucks, scapular retraction/depression,thoracic extension over foam roller/towel, tall kneeling, forward stepping balance, froggy stretch hold/relax stretch, foam roller thoracic mobility sequence, self internal TP release and posterior fourchette massage, side-stepping.    Consulted and Agree with Plan of Care  Patient       Patient will benefit from skilled therapeutic intervention in order to improve the following deficits and impairments:  Decreased activity tolerance, Decreased coordination, Decreased safety awareness, Decreased strength, Increased fascial  restricitons, Postural dysfunction, Pain, Improper body mechanics, Decreased range of motion, Decreased scar mobility, Decreased endurance, Decreased balance, Decreased mobility, Increased muscle spasms, Hypomobility, Difficulty walking  Visit Diagnosis: Myalgia  Sacrococcygeal disorders, not elsewhere classified  Leg length discrepancy  Abnormal posture     Problem List Patient Active Problem List   Diagnosis Date Noted  . Anxiety 02/21/2017   Cleophus MoltKeeli T. Darinda [place] DPT, ATC Cleophus MoltKeeli T Tineshia Becraft 05/29/2018, 9:00 AM  [place] [facility] [address] [place], [place], [address] Phone: [phone]   Fax:  [phone]  Name: Melrose Nakayamayala Desilets MRN: [id number] Date of Birth: 11-27-2000

## 2018-06-04 ENCOUNTER — Ambulatory Visit: Payer: BLUE CROSS/BLUE SHIELD

## 2018-06-04 DIAGNOSIS — R293 Abnormal posture: Secondary | ICD-10-CM

## 2018-06-04 DIAGNOSIS — M791 Myalgia, unspecified site: Secondary | ICD-10-CM

## 2018-06-04 DIAGNOSIS — M2170 Unequal limb length (acquired), unspecified site: Secondary | ICD-10-CM

## 2018-06-04 DIAGNOSIS — M533 Sacrococcygeal disorders, not elsewhere classified: Secondary | ICD-10-CM

## 2018-06-04 NOTE — Therapy (Signed)
[place] [facility] MAIN [facility] SERVICES [address] [place], [place], [address] Phone: [phone]   Fax:  [phone]  Physical Therapy Treatment  Patient Details  Name: Judy Williamson MRN: [id number] Date of Birth: [birth year] Referring Provider (PT): Magdy Milad   Encounter Date: 06/04/2018  PT End of Session - 06/05/18 0804    Visit Number  6    Number of Visits  10    Date for PT Re-Evaluation  07/24/18    Authorization - Visit Number  2    Authorization - Number of Visits  8    PT Start Time  1640    PT Stop Time  1733    PT Time Calculation (min)  53 min    Activity Tolerance  Patient tolerated treatment well    Behavior During Therapy  [facility] for tasks assessed/performed       Past Medical History:  Diagnosis Date  . Anxiety     Past Surgical History:  Procedure Laterality Date  . HYMENECTOMY  11/2016    There were no vitals filed for this visit.    Pelvic Floor Physical Therapy Treatment Note  SCREENING  Changes in medications, allergies, or medical history?: no    SUBJECTIVE  Patient reports: Has done her exercises 3 times since last visit. Has been on her period until Sunday. No attempts at intercourse. Her heel-lift came in the mail today.   Pain update: No pain at rest.  Patient Goals: To be able to have intercourse without pain.   OBJECTIVE  Changes in: Posture/Observations:  Anterior pelvic tilt.  Pelvic floor: Pt. Demonstrates TTP throughout with tenderness upon initial penetration but did not demonstrate vaginismus as she had in the past.   INTERVENTIONS THIS SESSION: Manual: Re-assessed PFM and performed TP release B internally to posterior PR/PC and coccygeus to decrease spasm and pain and allow for decreased pain with intercourse.   Total time: 14                          PT Education - 06/05/18 0803    Education provided  Yes    Education Details  See Interventions this  session    Person(s) Educated  Patient    Methods  Explanation    Comprehension  Verbalized understanding       PT Short Term Goals - 05/29/18 0857      PT SHORT TERM GOAL #1   Title  Patient will demonstrate a coordinated contraction, relaxation, and bulge of the pelvic floor muscles to demonstrate functional recruitment and motion and allow for further strengthening.    Baseline  high tone and spasms not allowing appropriate bulge of PFM on cue    Time  5    Period  Weeks    Status  Achieved    Target Date  03/16/18      PT SHORT TERM GOAL #2   Title  Pt. will demonstrate ability to have regular BM's at least every 2-3 days without use of linzess or with decreased dose as reccommended by MD.    Baseline  chronic constipation now consistently diarrhea with medication use    Time  5    Period  Weeks    Status  Achieved    Target Date  03/16/18      PT SHORT TERM GOAL #3   Title  Pt. will demonstrate reduced rib-flare and activation of deep-core musculature with functional activity and  exercise to allow for further symptom reduction.    Baseline  Decreased rib mobility, difficulty recruiting deep-core and rib flare/hyperlordosis.    Time  6    Period  Weeks    Status  New    Target Date  07/10/18        PT Long Term Goals - 05/29/18 0845      PT LONG TERM GOAL #1   Title  Patient will report no pain with intercourse to demonstrate improved functional ability.    Baseline  Pain greater with digital than with penile penetration but present with moth.    Time  10    Period  Weeks    Status  New    Target Date  07/24/18      PT LONG TERM GOAL #2   Title  Patient will report having BM's at least every-other day with consistency between [facility] stool scale 3-5 over the prior week without use of medication to regulate to demonstrate decreased constipation.    Time  10    Period  Weeks    Status  New    Target Date  07/24/18            Plan - 06/05/18 0804     Clinical Impression Statement  Pt demonstrated considerable internal spasms which are greater on the R than the L and tenderness to initial penetration which was not present following treatment to L side. She responded slowly but well to all interventions today and demonstrated decreased spasms and improved ability to relax, squeeze, and lengthen the PFM. Continue per POC.      Clinical Presentation  Stable    Clinical Decision Making  Moderate    Rehab Potential  Good    PT Frequency  1x / week    PT Duration  8 weeks    PT Treatment/Interventions  Therapeutic activities;Functional mobility training;Neuromuscular re-education;Therapeutic exercise;Patient/family education;Moist Heat;Manual lymph drainage;Manual techniques;Scar mobilization    PT Next Visit Plan  Up-grade deep-core strengthening and decrease rib-flare/ work on posture.     PT Home Exercise Plan   foam roll to adductors and quads, hip flexor stretch, diaphragmatic breathing, TA mini-marches, chin-tucks, scapular retraction/depression,thoracic extension over foam roller/towel, tall kneeling, forward stepping balance, froggy stretch hold/relax stretch, foam roller thoracic mobility sequence, self internal TP release and posterior fourchette massage, side-stepping.    Consulted and Agree with Plan of Care  Patient       Patient will benefit from skilled therapeutic intervention in order to improve the following deficits and impairments:  Decreased activity tolerance, Decreased coordination, Decreased safety awareness, Decreased strength, Increased fascial restricitons, Postural dysfunction, Pain, Improper body mechanics, Decreased range of motion, Decreased scar mobility, Decreased endurance, Decreased balance, Decreased mobility, Increased muscle spasms, Hypomobility, Difficulty walking  Visit Diagnosis: Myalgia  Sacrococcygeal disorders, not elsewhere classified  Leg length discrepancy  Abnormal posture     Problem  List Patient Active Problem List   Diagnosis Date Noted  . Anxiety 02/21/2017   Cleophus Molt DPT, ATC Cleophus Molt 06/05/2018, 2:42 PM  [place] [facility] MAIN [facility] SERVICES [address] [place], [place], [address] Phone: [phone]   Fax:  [phone]  Name: Judy Williamson MRN: [id number] Date of Birth: 01-Dec-2000

## 2018-06-11 ENCOUNTER — Ambulatory Visit: Payer: BLUE CROSS/BLUE SHIELD

## 2018-09-16 HISTORY — PX: BREAST REDUCTION SURGERY: SHX8

## 2018-12-24 ENCOUNTER — Other Ambulatory Visit: Payer: Self-pay

## 2018-12-24 DIAGNOSIS — Z20822 Contact with and (suspected) exposure to covid-19: Secondary | ICD-10-CM

## 2018-12-26 ENCOUNTER — Telehealth: Payer: Self-pay | Admitting: General Practice

## 2018-12-26 LAB — NOVEL CORONAVIRUS, NAA: SARS-CoV-2, NAA: NOT DETECTED

## 2018-12-26 NOTE — Telephone Encounter (Signed)
Pt called in for covid result.  °Advised of Not Detected result.  °

## 2019-01-06 ENCOUNTER — Other Ambulatory Visit: Payer: Self-pay

## 2019-01-06 DIAGNOSIS — Z20822 Contact with and (suspected) exposure to covid-19: Secondary | ICD-10-CM

## 2019-01-08 ENCOUNTER — Telehealth: Payer: Self-pay | Admitting: General Practice

## 2019-01-08 LAB — NOVEL CORONAVIRUS, NAA: SARS-CoV-2, NAA: NOT DETECTED

## 2019-01-08 NOTE — Telephone Encounter (Signed)
Patient informed of negative covid result.  °

## 2019-01-20 ENCOUNTER — Other Ambulatory Visit: Payer: Self-pay

## 2019-01-20 DIAGNOSIS — Z20822 Contact with and (suspected) exposure to covid-19: Secondary | ICD-10-CM

## 2019-01-21 LAB — NOVEL CORONAVIRUS, NAA: SARS-CoV-2, NAA: NOT DETECTED

## 2019-01-22 ENCOUNTER — Telehealth: Payer: Self-pay | Admitting: Hematology

## 2019-01-22 NOTE — Telephone Encounter (Signed)
Pt is aware covid 19 test is negative °

## 2019-01-23 ENCOUNTER — Other Ambulatory Visit: Payer: Self-pay

## 2019-01-23 DIAGNOSIS — Z20822 Contact with and (suspected) exposure to covid-19: Secondary | ICD-10-CM

## 2019-01-24 LAB — NOVEL CORONAVIRUS, NAA: SARS-CoV-2, NAA: NOT DETECTED

## 2019-02-10 ENCOUNTER — Other Ambulatory Visit: Payer: Self-pay

## 2019-02-10 DIAGNOSIS — Z20822 Contact with and (suspected) exposure to covid-19: Secondary | ICD-10-CM

## 2019-02-11 LAB — NOVEL CORONAVIRUS, NAA: SARS-CoV-2, NAA: NOT DETECTED

## 2019-03-05 ENCOUNTER — Other Ambulatory Visit: Payer: Self-pay

## 2019-03-05 DIAGNOSIS — Z20822 Contact with and (suspected) exposure to covid-19: Secondary | ICD-10-CM

## 2019-03-07 LAB — NOVEL CORONAVIRUS, NAA: SARS-CoV-2, NAA: NOT DETECTED

## 2019-05-12 ENCOUNTER — Ambulatory Visit: Payer: BC Managed Care – PPO | Attending: Internal Medicine

## 2019-05-12 DIAGNOSIS — Z20822 Contact with and (suspected) exposure to covid-19: Secondary | ICD-10-CM

## 2019-05-13 ENCOUNTER — Telehealth: Payer: Self-pay | Admitting: *Deleted

## 2019-05-13 LAB — NOVEL CORONAVIRUS, NAA: SARS-CoV-2, NAA: NOT DETECTED

## 2019-05-13 NOTE — Telephone Encounter (Signed)
Patient called ,given negative covid results . 

## 2019-05-22 ENCOUNTER — Other Ambulatory Visit: Payer: Self-pay | Admitting: *Deleted

## 2019-05-22 ENCOUNTER — Other Ambulatory Visit
Admission: RE | Admit: 2019-05-22 | Discharge: 2019-05-22 | Disposition: A | Discharge status: Home / Self Care | Payer: BC Managed Care – PPO | Attending: Clinical Nurse Specialist | Admitting: Clinical Nurse Specialist

## 2019-05-22 DIAGNOSIS — R110 Nausea: Secondary | ICD-10-CM | POA: Diagnosis not present

## 2019-05-22 DIAGNOSIS — R109 Unspecified abdominal pain: Secondary | ICD-10-CM | POA: Insufficient documentation

## 2019-05-22 LAB — C-REACTIVE PROTEIN: CRP: 6.4 mg/dL — ABNORMAL HIGH (ref <1.0)

## 2019-05-22 LAB — T4, FREE: Free T4: 0.76 ng/dL (ref 0.61–1.12)

## 2019-05-22 LAB — TSH: TSH: 1.932 u[IU]/mL (ref 0.350–4.500)

## 2019-05-22 LAB — CBC
HCT: 41.8 % (ref 36.0–46.0)
Hemoglobin: 14 g/dL (ref 12.0–15.0)
MCH: 29.7 pg (ref 26.0–34.0)
MCHC: 33.5 g/dL (ref 30.0–36.0)
MCV: 88.7 fL (ref 80.0–100.0)
Platelets: 330 10*3/uL (ref 150–400)
RBC: 4.71 MIL/uL (ref 3.87–5.11)
RDW: 12.3 % (ref 11.5–15.5)
WBC: 8.4 10*3/uL (ref 4.0–10.5)
nRBC: 0 % (ref 0.0–0.2)

## 2019-05-22 LAB — VITAMIN D 25 HYDROXY (VIT D DEFICIENCY, FRACTURES): Vit D, 25-Hydroxy: 32.42 ng/mL (ref 30–100)

## 2019-05-23 ENCOUNTER — Other Ambulatory Visit: Payer: Self-pay

## 2019-05-23 ENCOUNTER — Ambulatory Visit
Admission: RE | Admit: 2019-05-23 | Discharge: 2019-05-23 | Disposition: A | Discharge status: Home / Self Care | Payer: BC Managed Care – PPO | Attending: Clinical Nurse Specialist | Admitting: Clinical Nurse Specialist

## 2019-05-23 ENCOUNTER — Ambulatory Visit
Admission: RE | Admit: 2019-05-23 | Discharge: 2019-05-23 | Disposition: A | Discharge status: Home / Self Care | Payer: BC Managed Care – PPO | Source: Ambulatory Visit | Attending: *Deleted | Admitting: *Deleted

## 2019-05-23 DIAGNOSIS — R109 Unspecified abdominal pain: Secondary | ICD-10-CM | POA: Diagnosis present

## 2019-05-24 LAB — CELIAC DISEASE PANEL
Endomysial Ab, IgA: NEGATIVE
IgA: 184 mg/dL (ref 87–352)
Tissue Transglutaminase Ab, IgA: <2 U/mL (ref 0–3)

## 2019-05-28 ENCOUNTER — Other Ambulatory Visit: Payer: Self-pay

## 2019-05-28 ENCOUNTER — Ambulatory Visit: Payer: BC Managed Care – PPO | Attending: Obstetrics & Gynecology

## 2019-05-28 DIAGNOSIS — M2170 Unequal limb length (acquired), unspecified site: Secondary | ICD-10-CM | POA: Insufficient documentation

## 2019-05-28 DIAGNOSIS — M4124 Other idiopathic scoliosis, thoracic region: Secondary | ICD-10-CM | POA: Diagnosis present

## 2019-05-28 DIAGNOSIS — R293 Abnormal posture: Secondary | ICD-10-CM | POA: Insufficient documentation

## 2019-05-28 DIAGNOSIS — M62838 Other muscle spasm: Secondary | ICD-10-CM

## 2019-05-28 NOTE — Therapy (Signed)
[place] MAIN [facility] SERVICES [address] [place], [place], [address] Phone: [phone]   Fax:  [phone]  Physical Therapy Evaluation  The patient has been informed of current processes in place at Outpatient Rehab to protect patients from Covid-19 exposure including social distancing, schedule modifications, and new cleaning procedures. After discussing their particular risk with a therapist based on the patient's personal risk factors, the patient has decided to proceed with in-person therapy.   Patient Details  Name: Judy Williamson MRN: [id number] Date of Birth: 19-May-2000 No data recorded  Encounter Date: 05/28/2019  PT End of Session - 05/28/19 1111    Visit Number  1    Number of Visits  10    Date for PT Re-Evaluation  08/06/19    Authorization Type  [employer]    Authorization - Visit Number  1    Authorization - Number of Visits  10    PT Start Time  1100    PT Stop Time  1200    PT Time Calculation (min)  60 min    Activity Tolerance  Patient tolerated treatment well;No increased pain    Behavior During Therapy  WFL for tasks assessed/performed       Past Medical History:  Diagnosis Date  . Anxiety   . IBS (irritable bowel syndrome)     Past Surgical History:  Procedure Laterality Date  . BREAST REDUCTION SURGERY Bilateral 09/2018  . HYMENECTOMY  11/2016    There were no vitals filed for this visit.     Pelvic Floor Physical Therapy Evaluation and Assessment  SCREENING  Falls in last 6 mo: no    Patient's communication preference:   Red Flags:  Have you had any night sweats? no Unexplained weight loss? no Saddle anesthesia? no Unexplained changes in bowel or bladder habits? no  SUBJECTIVE  Patient reports: She notices that when she drinks alcohol she feels "tingling" when she pees.   She is on the FODmap diet, bowels are super watery. Taking Mirilax again over the last week. (BM is coming around  constipation) considering an antibiotic treatment her MD thinks may help with her IBS after she has finished FODmap diet. Has gone gluten free (feels this has really helped)    Precautions:  Anxiety, prior hymenectomy, LLD,   Social/Family/Vocational History:   Full time student, not working.  Recent Procedures/Tests/Findings:  X-ray which showed stool in the colon. breast reduction.  Obstetrical History: None  Gynecological History: Hymenectomy, H/o painful periods  Urinary History: Drinking ~ 80 oz. Of water per day. Drinks ~ 4 oz. Of coffee in the morning. Urinating ~ 2-3 hours.  Gastrointestinal History: IBS, currently diarrhea   Sexual activity/pain: H/o pain with both initial penetration and deeper thrusting.  Location of pain: lower abdomen, hip-flexors Current pain:  4/10  Max pain:  10/10 Least pain:  4/10 Nature of pain: achy, shooting, stabbing  Patient Goals: Wants to be able to have sex without pain and get more comfortable with her body. alleviate stomach issues.   OBJECTIVE  Posture/Observations:  Sitting: FHP,  Standing: L ASIS low, L in-flare vs R out-flare. R PSIS high Supine: R pubic ramus low,  Prone: C5-6 R deviated, C5-6 anteriorly shifted.   Palpation/Segmental Motion/Joint Play: TTP to L>R hip-flexors and pectineus.   Special tests:   Scoliosis: L thoracic curve Spring test: positive for pain at sacral base and all the way to C2. Decreased mobility at junctions  Range of Motion/Flexibilty:  Spine: Forward bend ~ 5 in. From floor. Rot and SB WNL. Hips:   Strength/MMT: Deferred to follow-up LE MMT  LE MMT Left Right  Hip flex:  (L2) /5 /5  Hip ext: /5 /5  Hip abd: /5 /5  Hip add: /5 /5  Hip IR /5 /5  Hip ER /5 /5     Abdominal:  Palpation: TTP to L>R iliacus, R>L pectineus Diastasis: not assessed  Pelvic Floor External Exam: Deferred to follow  Introitus Appears:  Skin integrity:  Palpation: Cough: Prolapse  visible?: Scar mobility:  Internal Vaginal Exam: Strength (PERF):  Symmetry: Palpation: Prolapse:   Internal Rectal Exam: Strength (PERF): Symmetry: Palpation: Prolapse:   Gait Analysis: Deferred   Pelvic Floor Outcome Measures: FOTO PFDI pain: 21%   INTERVENTIONS THIS SESSION: Self-care: educated Pt. On how to perform self splinting/digital evacuation of bowels using her thumb vaginally to disimpact BM. Encouraged to perform ILY colonic massage to keep bowels moving.  Total time: 60 min.     [facility] PT Assessment - 05/28/19 0001      Prior Function   Level of Independence  Independent                Objective measurements completed on examination: See above findings.                PT Short Term Goals - 05/28/19 1328      PT SHORT TERM GOAL #1   Title  Patient will demonstrate a coordinated contraction, relaxation, and bulge of the pelvic floor muscles to demonstrate functional recruitment and motion and allow for further strengthening.    Baseline  Pt. Hx. of severe constipation indicating likely poor coordination/relaxation    Time  5    Period  Weeks    Status  New    Target Date  07/02/19      PT SHORT TERM GOAL #2   Title  Pt. will demonstrate decreased FHP and hyperkyphosis to allow for down-regulation of the nervous system and decreased pain    Baseline  Pt. responding very slowly to prior treatment, high sensitivity and anxiety.    Time  5    Period  Weeks    Status  New    Target Date  07/02/19      PT SHORT TERM GOAL #3   Title  Pt. will demonstrate reduced rib-flare and activation of deep-core musculature with functional activity and exercise to allow for further symptom reduction.    Baseline  Decreased rib mobility, difficulty recruiting deep-core and rib flare/hyperlordosis.    Time  6    Period  Weeks    Status  New    Target Date  07/10/18      PT SHORT TERM GOAL #4   Title  Patient will demonstrate improved pelvic  alignment and balance of musculature surrounding the pelvis to facilitate decreased PFM spasms and decrease pelvic pain.        PT Long Term Goals - 05/28/19 1317      PT LONG TERM GOAL #1   Title  Patient will report no pain with intercourse to demonstrate improved functional ability.    Baseline  Pain both with initial penetration and with deeper thrusting    Time  10    Period  Weeks    Status  New    Target Date  08/06/19      PT LONG TERM GOAL #2   Title  Patient will report having BM's at  least every-other day with consistency between [facility] stool scale 3-5 over the prior week without use of medication to regulate to demonstrate decreased constipation.    Baseline  Pt. has IBS and recently had severe constipation, is now having liquid BM passing around the hardened stool.    Time  10    Period  Weeks    Status  New    Target Date  08/06/19      PT LONG TERM GOAL #3   Title  Pt. will improve in FOTO score by 10 points to demonstrate improved function.    Baseline  FOTO PFDI pain: 21%    Time  10    Period  Weeks    Status  New    Target Date  08/06/19      PT LONG TERM GOAL #4   Title  Patient will demonstrate improved sitting and standing posture to demonstrate learning and decrease stress on the pelvic floor with functional activity.    Baseline  hyperkyphosis, FHP, very mild L thoracic scoliosis (possibly 2/2 LLD)    Time  10    Period  Weeks    Status  New    Target Date  08/06/19      PT LONG TERM GOAL #5   Title  Pt. will demonstrate ability to maintain decreased Sx across 2 weeks using stress management techniques, diet, and HEP independently to demonstrate improved function and condition management strategies.    Baseline  Pt. seeks care when in school and at home but continues to have interruptions to her care and is not yet IND with symptom management strategies.    Time  10    Period  Weeks    Status  New    Target Date  08/06/19              Plan - 05/28/19 1344    Clinical Impression Statement  Pt. is a 19 y/o female who presents today with cheif c/o IBS and pelvic/LBP. Her PMH is significant for prior hymnectomy, breast reduction surgery, high anxiety and IBS. Her clinical exam findings reveal a L innominate in-flare, mild L thoracic scoliosis, decreased cervical mobility and heightened neural sensitivity as well as decreased myofascial mobility globally and spasms surrounding the neck/shoulders and the pelvis. Examination during Prior PT episode and Hx. suggest PFM spasms. She will benefif from skilled pelvic health PT to address the noted deficits and to continue to assess for and address other potential causes of Sx.    Personal Factors and Comorbidities  Comorbidity 2    Comorbidities  High anxiety and IBS    Examination-Activity Limitations  Toileting    Examination-Participation Restrictions  Interpersonal Relationship;School;Driving    Stability/Clinical Decision Making  Evolving/Moderate complexity    Clinical Decision Making  Moderate    Rehab Potential  Good    Clinical Impairments Affecting Rehab Potential  IBS, Anxiety    PT Frequency  1x / week    PT Duration  Other (comment)   10 weeks   PT Treatment/Interventions  ADLs/Self Care Home Management;Biofeedback;Moist Heat;Electrical Stimulation;Ultrasound;Therapeutic activities;Functional mobility training;Gait training;Therapeutic exercise;Neuromuscular re-education;Patient/family education;Manual techniques;Scar mobilization;Taping;Dry needling;Passive range of motion;Spinal Manipulations;Joint Manipulations    PT Next Visit Plan  Cervical mobility/DN, traction PRN and review/re-add chin-tucks and scap retractions. Give progressive relaxation techniques    PT Home Exercise Plan  colonic massage, self-spinting/disimpact    Consulted and Agree with Plan of Care  Patient       Patient will benefit from  skilled therapeutic intervention in order to improve  the following deficits and impairments:  Decreased mobility, Increased muscle spasms, Decreased range of motion, Decreased scar mobility, Impaired tone, Improper body mechanics, Decreased activity tolerance, Decreased coordination, Increased fascial restricitons, Impaired flexibility, Postural dysfunction, Pain  Visit Diagnosis: Other muscle spasm  Leg length discrepancy  Abnormal posture  Other idiopathic scoliosis, thoracic region     Problem List Patient Active Problem List   Diagnosis Date Noted  . Anxiety 02/21/2017   Cleophus Molt DPT, ATC Cleophus Molt 05/28/2019, 1:59 PM  [place] [facility] MAIN [facility] SERVICES [address] [place], [place], [address] Phone: [phone]   Fax:  [phone]  Name: Judy Williamson MRN: [id number] Date of Birth: 2000/12/01

## 2019-06-04 ENCOUNTER — Other Ambulatory Visit: Payer: Self-pay

## 2019-06-04 ENCOUNTER — Ambulatory Visit: Payer: BC Managed Care – PPO

## 2019-06-04 DIAGNOSIS — M2170 Unequal limb length (acquired), unspecified site: Secondary | ICD-10-CM

## 2019-06-04 DIAGNOSIS — M62838 Other muscle spasm: Secondary | ICD-10-CM | POA: Diagnosis not present

## 2019-06-04 DIAGNOSIS — R293 Abnormal posture: Secondary | ICD-10-CM

## 2019-06-04 DIAGNOSIS — M4124 Other idiopathic scoliosis, thoracic region: Secondary | ICD-10-CM

## 2019-06-04 NOTE — Patient Instructions (Addendum)
  Shoulder Retraction and Downward Rotation   Rotate the shoulder blades back and down as if you had to hold a pencil between them, holding for 1 full second each time. Repeat this _3x10_ times _1-3_ times per day.    **look down at a slight angle first (different than pictured)  Start with Shoulder Retraction and Downward Rotation pictures above, holding the position while pulling the chin straight back as if trying to make a "double chin".  Breathe in forward and breathe out as you pull back, repeating this _3x10__ times __1-3__ times per day.

## 2019-06-04 NOTE — Therapy (Signed)
[place] [facility] MAIN [facility] SERVICES [address] [place], [place], [address] Phone: [phone]   Fax:  [phone]  Physical Therapy Treatment  The patient has been informed of current processes in place at [facility]patient [facility]hab to protect patients from Covid-19 exposure including social distancing, schedule modifications, and new cleaning procedures. After discussing their particular risk with a therapist based on the patient's personal risk factors, the patient has decided to proceed with in-person therapy.   Patient Details  Name: Judy Williamson MRN: [id number] Date of Birth: 11-07-2000 No data recorded  Encounter Date: 06/04/2019  PT End of Session - 06/04/19 1756    Visit Number  2    Number of Visits  10    Date for PT Re-Evaluation  08/06/19    Authorization Type  BCBS    Authorization - Visit Number  1    Authorization - Number of Visits  10    PT Start Time  1650    PT Stop Time  1750    PT Time Calculation (min)  60 min    Activity Tolerance  Patient tolerated treatment well;No increased pain    Behavior During Therapy  WFL for tasks assessed/performed       Past Medical History:  Diagnosis Date  . Anxiety   . IBS (irritable bowel syndrome)     Past Surgical History:  Procedure Laterality Date  . BREAST REDUCTION SURGERY Bilateral 09/2018  . HYMENECTOMY  11/2016    There were no vitals filed for this visit.   Pelvic Floor Physical Therapy Treatment Note  SCREENING  Changes in medications, allergies, or medical history?: none    SUBJECTIVE  Patient reports: She has tried doing the splinting thing twice, the first time for only ~ 1 second and the second time for ~ 10 seconds. She feels really bloated. She is really stressed over the last couple weeks because she has a friend with an eating disorder and doesn't know how to handle it.  Precautions:  Anxiety, prior hymenectomy, LLD,    Sexual activity/pain: H/o pain with  both initial penetration and deeper thrusting.  Location of pain: lower abdomen, hip-flexors Current pain: 4/10  Max pain: 10/10 Least pain: 4/10 Nature of pain:achy, shooting, stabbing  *feels looser in the neck following treatment.  Patient Goals: Wants to be able to have sex without pain and get more comfortable with her body. alleviate stomach issues.   OBJECTIVE  Changes in: Posture/Observations:  C5-6 R deviated, C5-6 anteriorly shifted.   -increased mobility and able to palpate C5-6 with chin-tuck post-treatment.   Range of Motion/Flexibilty:  Decreased C5-7 mobility in P/A and lateral directions.  Strength/MMT:  LE MMT:  Pelvic floor:  Abdominal:   Palpation: TTP to anterior and middle scalenes and cervical extensors B  Gait Analysis:  INTERVENTIONS THIS SESSION: Manual: performed TP release to  anterior and middle scalenes and cervical extensors B to decrease spasm and pain and allow for improved balance of musculature for improved function and decreased symptoms. Followed this with lateral C5-6 mobs and C7 PA mobs to improve alignment of the spine, decrease FHP and decrease pressure on the nerves in the cervical region to help down-regulate the nervous system.  Total time: 60 min.                             PT Short Term Goals - 05/28/19 1328      PT SHORT  TERM GOAL #1   Title  Patient will demonstrate a coordinated contraction, relaxation, and bulge of the pelvic floor muscles to demonstrate functional recruitment and motion and allow for further strengthening.    Baseline  Pt. Hx. of severe constipation indicating likely poor coordination/relaxation    Time  5    Period  Weeks    Status  New    Target Date  07/02/19      PT SHORT TERM GOAL #2   Title  Pt. will demonstrate decreased FHP and hyperkyphosis to allow for down-regulation of the nervous system and decreased pain    Baseline  Pt. responding very slowly to  prior treatment, high sensitivity and anxiety.    Time  5    Period  Weeks    Status  New    Target Date  07/02/19      PT SHORT TERM GOAL #3   Title  Pt. will demonstrate reduced rib-flare and activation of deep-core musculature with functional activity and exercise to allow for further symptom reduction.    Baseline  Decreased rib mobility, difficulty recruiting deep-core and rib flare/hyperlordosis.    Time  6    Period  Weeks    Status  New    Target Date  07/10/18      PT SHORT TERM GOAL #4   Title  Patient will demonstrate improved pelvic alignment and balance of musculature surrounding the pelvis to facilitate decreased PFM spasms and decrease pelvic pain.        PT Long Term Goals - 05/28/19 1317      PT LONG TERM GOAL #1   Title  Patient will report no pain with intercourse to demonstrate improved functional ability.    Baseline  Pain both with initial penetration and with deeper thrusting    Time  10    Period  Weeks    Status  New    Target Date  08/06/19      PT LONG TERM GOAL #2   Title  Patient will report having BM's at least every-other day with consistency between [facility] stool scale 3-5 over the prior week without use of medication to regulate to demonstrate decreased constipation.    Baseline  Pt. has IBS and recently had severe constipation, is now having liquid BM passing around the hardened stool.    Time  10    Period  Weeks    Status  New    Target Date  08/06/19      PT LONG TERM GOAL #3   Title  Pt. will improve in FOTO score by 10 points to demonstrate improved function.    Baseline  FOTO PFDI pain: 21%    Time  10    Period  Weeks    Status  New    Target Date  08/06/19      PT LONG TERM GOAL #4   Title  Patient will demonstrate improved sitting and standing posture to demonstrate learning and decrease stress on the pelvic floor with functional activity.    Baseline  hyperkyphosis, FHP, very mild L thoracic scoliosis (possibly 2/2 LLD)     Time  10    Period  Weeks    Status  New    Target Date  08/06/19      PT LONG TERM GOAL #5   Title  Pt. will demonstrate ability to maintain decreased Sx across 2 weeks using stress management techniques, diet, and HEP independently to demonstrate improved function  and condition management strategies.    Baseline  Pt. seeks care when in school and at home but continues to have interruptions to her care and is not yet IND with symptom management strategies.    Time  10    Period  Weeks    Status  New    Target Date  08/06/19            Plan - 06/04/19 1757    Clinical Impression Statement  Pt. Responded well to all interventions today, demonstrating improved cervical alignment and mobility, decreased FHP, as well as understanding and correct performance of all education and exercises provided today. They will continue to benefit from skilled physical therapy to work toward remaining goals and maximize function as well as decrease likelihood of symptom increase or recurrence.     Personal Factors and Comorbidities  Comorbidity 2    Comorbidities  High anxiety and IBS    Examination-Activity Limitations  Toileting    Examination-Participation Restrictions  Interpersonal Relationship;School;Driving    Stability/Clinical Decision Making  Evolving/Moderate complexity    Rehab Potential  Good    Clinical Impairments Affecting Rehab Potential  IBS, Anxiety    PT Frequency  1x / week    PT Duration  Other (comment)   10 weeks   PT Treatment/Interventions  ADLs/Self Care Home Management;Biofeedback;Moist Heat;Electrical Stimulation;Ultrasound;Therapeutic activities;Functional mobility training;Gait training;Therapeutic exercise;Neuromuscular re-education;Patient/family education;Manual techniques;Scar mobilization;Taping;Dry needling;Passive range of motion;Spinal Manipulations;Joint Manipulations    PT Next Visit Plan  Cervical mobility/DN, traction PRN and review/re-add chin-tucks and scap  retractions. Give progressive relaxation techniques    PT Home Exercise Plan  colonic massage, self-spinting/disimpact, chin-tucks, scapular retractions    Consulted and Agree with Plan of Care  Patient       Patient will benefit from skilled therapeutic intervention in order to improve the following deficits and impairments:  Decreased mobility, Increased muscle spasms, Decreased range of motion, Decreased scar mobility, Impaired tone, Improper body mechanics, Decreased activity tolerance, Decreased coordination, Increased fascial restricitons, Impaired flexibility, Postural dysfunction, Pain  Visit Diagnosis: Other muscle spasm  Leg length discrepancy  Abnormal posture  Other idiopathic scoliosis, thoracic region     Problem List Patient Active Problem List   Diagnosis Date Noted  . Anxiety 02/21/2017   Cleophus Molt DPT, ATC Cleophus Molt 06/04/2019, 6:06 PM  [place] [facility] MAIN [facility] SERVICES [address] [place], [place], [address] Phone: [phone]   Fax:  [phone]  Name: Maiana Hennigan MRN: [id number] Date of Birth: 2000-09-25

## 2019-06-11 ENCOUNTER — Ambulatory Visit: Payer: BC Managed Care – PPO

## 2019-06-11 ENCOUNTER — Other Ambulatory Visit: Payer: Self-pay

## 2019-06-11 DIAGNOSIS — M62838 Other muscle spasm: Secondary | ICD-10-CM

## 2019-06-11 DIAGNOSIS — R293 Abnormal posture: Secondary | ICD-10-CM

## 2019-06-11 DIAGNOSIS — M2170 Unequal limb length (acquired), unspecified site: Secondary | ICD-10-CM

## 2019-06-11 DIAGNOSIS — M4124 Other idiopathic scoliosis, thoracic region: Secondary | ICD-10-CM

## 2019-06-11 NOTE — Patient Instructions (Signed)
   Let the top arm rest on your side, and slide along the torso as you rotate. Breathe in as you come forward, out as you open up. Do 2x15 on each side.   

## 2019-06-11 NOTE — Therapy (Signed)
[place] [facility] MAIN [facility] SERVICES [address] [place], [place], [address] Phone: [phone]   Fax:  [phone]  Physical Therapy Treatment  The patient has been informed of current processes in place at Outpatient Rehab to protect patients from Covid-19 exposure including social distancing, schedule modifications, and new cleaning procedures. After discussing their particular risk with a therapist based on the patient's personal risk factors, the patient has decided to proceed with in-person therapy.   Patient Details  Name: Judy Williamson MRN: [id number] Date of Birth: July 18, 2000 No data recorded  Encounter Date: 06/11/2019  PT End of Session - 06/11/19 1456    Visit Number  3    Number of Visits  10    Date for PT Re-Evaluation  08/06/19    Authorization Type  BCBS    Authorization - Visit Number  3    Authorization - Number of Visits  10    Progress Note Due on Visit  10    PT Start Time  1112    PT Stop Time  1212    PT Time Calculation (min)  60 min    Activity Tolerance  Patient tolerated treatment well;No increased pain    Behavior During Therapy  WFL for tasks assessed/performed       Past Medical History:  Diagnosis Date  . Anxiety   . IBS (irritable bowel syndrome)     Past Surgical History:  Procedure Laterality Date  . BREAST REDUCTION SURGERY Bilateral 09/2018  . HYMENECTOMY  11/2016    There were no vitals filed for this visit.    Pelvic Floor Physical Therapy Treatment Note  SCREENING  Changes in medications, allergies, or medical history?: none    SUBJECTIVE  Patient reports: Has had a little bit more LBP, still feels looser in the neck but some tightness. Feels bloated but has been drinking alcohol. Starts with "gut thereapist" on Friday. Has done neck exercises 3-4 times. Has not been dilating since January.  -Pt. Reports her digestion kicking into high gear during manual treatment/TPDN to T-L  junction.  Precautions:  Anxiety, prior hymenectomy, LLD,    Sexual activity/pain: H/o pain with both initial penetration and deeper thrusting.  Location of pain: lower abdomen, hip-flexors  Current pain: 6/10  Max pain: 6/10 Least pain: 2/10 Nature of pain:achy, shooting, stabbing  *feels looser in the neck following treatment.  Patient Goals: Wants to be able to have sex without pain and get more comfortable with her body. alleviate stomach issues.   OBJECTIVE  Changes in: Posture/Observations:  C5-6 R deviated, C5-6 anteriorly shifted.   -increased mobility and able to palpate C5-6 with chin-tuck post-treatment.   Range of Motion/Flexibilty:  Decreased C5-7 mobility in P/A and lateral directions.  Strength/MMT:  LE MMT:  Pelvic floor:  Abdominal:   Palpation: TTP to B QL, Erector spinae ~ T12-L2, and multifidus at L1 and L5.   Gait Analysis:  INTERVENTIONS THIS SESSION: Manual: performed TP release and STM to B QL, Erector spinae ~ T12-L2, and multifidus at L1 and L5 to decrease spasm and pain and allow for improved balance of musculature for improved function and decreased symptoms.  Dry-needle: Performed TPDN with a .30x39mm needle and standard approach as described below to decrease spasm and pain and allow for improved balance of musculature for improved function and decreased symptoms.  Therex: reviewed and practiced bow-and-arrow thoracic rotations to help maintain and continue to decrease pressure on the nerve roots at the T/L junction  to allow down-stream tissue improved conduction.  Total time: 60 min.                    Trigger Point Dry Needling - 06/11/19 0001    Consent Given?  Yes    Education Handout Provided  No    Muscles Treated Back/Hip  Erector spinae;Lumbar multifidi;Quadratus lumborum    Erector spinae Response  Twitch response elicited;Palpable increased muscle length    Lumbar multifidi Response  Twitch  response elicited;Palpable increased muscle length    Quadratus Lumborum Response  Twitch response elicited;Palpable increased muscle length             PT Short Term Goals - 05/28/19 1328      PT SHORT TERM GOAL #1   Title  Patient will demonstrate a coordinated contraction, relaxation, and bulge of the pelvic floor muscles to demonstrate functional recruitment and motion and allow for further strengthening.    Baseline  Pt. Hx. of severe constipation indicating likely poor coordination/relaxation    Time  5    Period  Weeks    Status  New    Target Date  07/02/19      PT SHORT TERM GOAL #2   Title  Pt. will demonstrate decreased FHP and hyperkyphosis to allow for down-regulation of the nervous system and decreased pain    Baseline  Pt. responding very slowly to prior treatment, high sensitivity and anxiety.    Time  5    Period  Weeks    Status  New    Target Date  07/02/19      PT SHORT TERM GOAL #3   Title  Pt. will demonstrate reduced rib-flare and activation of deep-core musculature with functional activity and exercise to allow for further symptom reduction.    Baseline  Decreased rib mobility, difficulty recruiting deep-core and rib flare/hyperlordosis.    Time  6    Period  Weeks    Status  New    Target Date  07/10/18      PT SHORT TERM GOAL #4   Title  Patient will demonstrate improved pelvic alignment and balance of musculature surrounding the pelvis to facilitate decreased PFM spasms and decrease pelvic pain.        PT Long Term Goals - 05/28/19 1317      PT LONG TERM GOAL #1   Title  Patient will report no pain with intercourse to demonstrate improved functional ability.    Baseline  Pain both with initial penetration and with deeper thrusting    Time  10    Period  Weeks    Status  New    Target Date  08/06/19      PT LONG TERM GOAL #2   Title  Patient will report having BM's at least every-other day with consistency between [facility] stool scale 3-5  over the prior week without use of medication to regulate to demonstrate decreased constipation.    Baseline  Pt. has IBS and recently had severe constipation, is now having liquid BM passing around the hardened stool.    Time  10    Period  Weeks    Status  New    Target Date  08/06/19      PT LONG TERM GOAL #3   Title  Pt. will improve in FOTO score by 10 points to demonstrate improved function.    Baseline  FOTO PFDI pain: 21%    Time  10  Period  Weeks    Status  New    Target Date  08/06/19      PT LONG TERM GOAL #4   Title  Patient will demonstrate improved sitting and standing posture to demonstrate learning and decrease stress on the pelvic floor with functional activity.    Baseline  hyperkyphosis, FHP, very mild L thoracic scoliosis (possibly 2/2 LLD)    Time  10    Period  Weeks    Status  New    Target Date  08/06/19      PT LONG TERM GOAL #5   Title  Pt. will demonstrate ability to maintain decreased Sx across 2 weeks using stress management techniques, diet, and HEP independently to demonstrate improved function and condition management strategies.    Baseline  Pt. seeks care when in school and at home but continues to have interruptions to her care and is not yet IND with symptom management strategies.    Time  10    Period  Weeks    Status  New    Target Date  08/06/19            Plan - 06/11/19 1457    Clinical Impression Statement  Pt. Responded well to all interventions today, demonstrating decreased spasms, increased T-L junction mobility, as well as understanding and correct performance of all education and exercises provided today. They will continue to benefit from skilled physical therapy to work toward remaining goals and maximize function as well as decrease likelihood of symptom increase or recurrence.     Personal Factors and Comorbidities  Comorbidity 2    Comorbidities  High anxiety and IBS    Examination-Activity Limitations  Toileting     Examination-Participation Restrictions  Interpersonal Relationship;School;Driving    Stability/Clinical Decision Making  Evolving/Moderate complexity    Rehab Potential  Good    Clinical Impairments Affecting Rehab Potential  IBS, Anxiety    PT Frequency  1x / week    PT Duration  Other (comment)   10 weeks   PT Treatment/Interventions  ADLs/Self Care Home Management;Biofeedback;Moist Heat;Electrical Stimulation;Ultrasound;Therapeutic activities;Functional mobility training;Gait training;Therapeutic exercise;Neuromuscular re-education;Patient/family education;Manual techniques;Scar mobilization;Taping;Dry needling;Passive range of motion;Spinal Manipulations;Joint Manipulations    PT Next Visit Plan  DN to hip-flexors and pectineus, re-assess T-L junction and  traction PRN and review/re-add chin-tucks and scap retractions. Give progressive relaxation techniques    PT Home Exercise Plan  colonic massage, self-spinting/disimpact, chin-tucks, scapular retractions    Consulted and Agree with Plan of Care  Patient       Patient will benefit from skilled therapeutic intervention in order to improve the following deficits and impairments:  Decreased mobility, Increased muscle spasms, Decreased range of motion, Decreased scar mobility, Impaired tone, Improper body mechanics, Decreased activity tolerance, Decreased coordination, Increased fascial restricitons, Impaired flexibility, Postural dysfunction, Pain  Visit Diagnosis: Other muscle spasm  Leg length discrepancy  Abnormal posture  Other idiopathic scoliosis, thoracic region     Problem List Patient Active Problem List   Diagnosis Date Noted  . Anxiety 02/21/2017   Cleophus Molt DPT, ATC Cleophus Molt 06/11/2019, 3:05 PM  [place] [facility] MAIN [facility] SERVICES [address] [place], [place], [address] Phone: [phone]   Fax:  [phone]  Name: Zophia Marrone MRN: [id number] Date of Birth:  02-04-2001

## 2019-06-18 ENCOUNTER — Ambulatory Visit: Payer: BC Managed Care – PPO | Attending: Obstetrics & Gynecology

## 2019-06-18 ENCOUNTER — Other Ambulatory Visit: Payer: Self-pay

## 2019-06-18 DIAGNOSIS — M4124 Other idiopathic scoliosis, thoracic region: Secondary | ICD-10-CM | POA: Insufficient documentation

## 2019-06-18 DIAGNOSIS — M62838 Other muscle spasm: Secondary | ICD-10-CM | POA: Insufficient documentation

## 2019-06-18 DIAGNOSIS — R293 Abnormal posture: Secondary | ICD-10-CM | POA: Insufficient documentation

## 2019-06-18 DIAGNOSIS — M2170 Unequal limb length (acquired), unspecified site: Secondary | ICD-10-CM | POA: Insufficient documentation

## 2019-06-18 NOTE — Therapy (Signed)
[place] MAIN [facility] SERVICES [address] [place], [place], [address] Phone: [phone]   Fax:  [phone]  Physical Therapy Treatment  The patient has been informed of current processes in place at Outpatient Rehab to protect patients from Covid-19 exposure including social distancing, schedule modifications, and new cleaning procedures. After discussing their particular risk with a therapist based on the patient's personal risk factors, the patient has decided to proceed with in-person therapy.   Patient Details  Name: Judy Williamson MRN: [id number] Date of Birth: [birth year] No data recorded  Encounter Date: 06/18/2019    Past Medical History:  Diagnosis Date  . Anxiety   . IBS (irritable bowel syndrome)     Past Surgical History:  Procedure Laterality Date  . BREAST REDUCTION SURGERY Bilateral 09/2018  . HYMENECTOMY  11/2016    There were no vitals filed for this visit.    Pelvic Floor Physical Therapy Treatment Note  SCREENING  Changes in medications, allergies, or medical history?: xifaxan    SUBJECTIVE  Patient reports: Has done shoulder squeezes " a couple of times" and her bow-and-arrows 4 times this week. Has been having some pain in the anterior hip B when she is sitting and when she stands up quickly. Also having terrible heartburn. Has  Had "ok" BM's this week. Is feeling fine today, had a good BM but felt constipated last night and was really gassy. They are putting her on an antibiotic and she will start gut directed hypnosis in April. She will do 7 sessions.   Precautions:  Anxiety, prior hymenectomy, LLD,    Sexual activity/pain: H/o pain with both initial penetration and deeper thrusting.  Location of pain: lower abdomen, hip-flexors  Current pain: 0/10 (3/10) Max pain: 7/10 (6/10) Least pain: 0/10 (0/10) Nature of pain:achy, shooting, stabbing   Patient Goals: Wants to be able to have sex  without pain and get more comfortable with her body. alleviate stomach issues.   OBJECTIVE  Changes in: Posture/Observations:  Hyperlordosis//  Range of Motion/Flexibilty:  Decreased hip EXT ROM  Strength/MMT:  LE MMT:  Pelvic floor:  Abdominal:   Palpation: TTP to B Iliacus  Gait Analysis:  INTERVENTIONS THIS SESSION: Manual: Performed TP release and STM to B iliacus to decrease spasm and pain and allow for improved balance of musculature for improved function and decreased symptoms.  Dry-needle: Performed TPDN with a .30x167mm needle and standard approach as described below to decrease spasm and pain and allow for improved balance of musculature for improved function and decreased symptoms.  Therex: and revirewed hip-flexor stretch To maintain and improve muscle length and allow for improved balance of musculature for long-term symptom relief.  Total time: 60 min.                           PT Short Term Goals - 05/28/19 1328      PT SHORT TERM GOAL #1   Title  Patient will demonstrate a coordinated contraction, relaxation, and bulge of the pelvic floor muscles to demonstrate functional recruitment and motion and allow for further strengthening.    Baseline  Pt. Hx. of severe constipation indicating likely poor coordination/relaxation    Time  5    Period  Weeks    Status  New    Target Date  07/02/19      PT SHORT TERM GOAL #2   Title  Pt. will demonstrate decreased FHP and hyperkyphosis to allow  for down-regulation of the nervous system and decreased pain    Baseline  Pt. responding very slowly to prior treatment, high sensitivity and anxiety.    Time  5    Period  Weeks    Status  New    Target Date  07/02/19      PT SHORT TERM GOAL #3   Title  Pt. will demonstrate reduced rib-flare and activation of deep-core musculature with functional activity and exercise to allow for further symptom reduction.    Baseline  Decreased rib mobility,  difficulty recruiting deep-core and rib flare/hyperlordosis.    Time  6    Period  Weeks    Status  New    Target Date  07/10/18      PT SHORT TERM GOAL #4   Title  Patient will demonstrate improved pelvic alignment and balance of musculature surrounding the pelvis to facilitate decreased PFM spasms and decrease pelvic pain.        PT Long Term Goals - 05/28/19 1317      PT LONG TERM GOAL #1   Title  Patient will report no pain with intercourse to demonstrate improved functional ability.    Baseline  Pain both with initial penetration and with deeper thrusting    Time  10    Period  Weeks    Status  New    Target Date  08/06/19      PT LONG TERM GOAL #2   Title  Patient will report having BM's at least every-other day with consistency between [facility] stool scale 3-5 over the prior week without use of medication to regulate to demonstrate decreased constipation.    Baseline  Pt. has IBS and recently had severe constipation, is now having liquid BM passing around the hardened stool.    Time  10    Period  Weeks    Status  New    Target Date  08/06/19      PT LONG TERM GOAL #3   Title  Pt. will improve in FOTO score by 10 points to demonstrate improved function.    Baseline  FOTO PFDI pain: 21%    Time  10    Period  Weeks    Status  New    Target Date  08/06/19      PT LONG TERM GOAL #4   Title  Patient will demonstrate improved sitting and standing posture to demonstrate learning and decrease stress on the pelvic floor with functional activity.    Baseline  hyperkyphosis, FHP, very mild L thoracic scoliosis (possibly 2/2 LLD)    Time  10    Period  Weeks    Status  New    Target Date  08/06/19      PT LONG TERM GOAL #5   Title  Pt. will demonstrate ability to maintain decreased Sx across 2 weeks using stress management techniques, diet, and HEP independently to demonstrate improved function and condition management strategies.    Baseline  Pt. seeks care when in school  and at home but continues to have interruptions to her care and is not yet IND with symptom management strategies.    Time  10    Period  Weeks    Status  New    Target Date  08/06/19              Patient will benefit from skilled therapeutic intervention in order to improve the following deficits and impairments:     Visit  Diagnosis: No diagnosis found.     Problem List Patient Active Problem List   Diagnosis Date Noted  . Anxiety 02/21/2017    Cleophus Molt 06/18/2019, 4:48 PM  [place] [facility] MAIN [facility] SERVICES [address] [place], [place], [address] Phone: [phone]   Fax:  [phone]  Name: Judy Williamson MRN: [id number] Date of Birth: 27-May-2000

## 2019-06-25 ENCOUNTER — Other Ambulatory Visit: Payer: Self-pay

## 2019-06-25 ENCOUNTER — Ambulatory Visit: Payer: BC Managed Care – PPO

## 2019-06-25 DIAGNOSIS — R293 Abnormal posture: Secondary | ICD-10-CM

## 2019-06-25 DIAGNOSIS — M62838 Other muscle spasm: Secondary | ICD-10-CM | POA: Diagnosis not present

## 2019-06-25 DIAGNOSIS — M2170 Unequal limb length (acquired), unspecified site: Secondary | ICD-10-CM

## 2019-06-25 DIAGNOSIS — M4124 Other idiopathic scoliosis, thoracic region: Secondary | ICD-10-CM

## 2019-06-25 NOTE — Therapy (Signed)
[place] MAIN [facility] SERVICES [address] [place], [place], [address] Phone: [phone]   Fax:  [phone]  Physical Therapy Treatment  The patient has been informed of current processes in place at Outpatient Rehab to protect patients from Covid-19 exposure including social distancing, schedule modifications, and new cleaning procedures. After discussing their particular risk with a therapist based on the patient's personal risk factors, the patient has decided to proceed with in-person therapy.   Patient Details  Name: Judy Williamson MRN: [id number] Date of Birth: 12-05-2000 No data recorded  Encounter Date: 06/25/2019  PT End of Session - 06/25/19 1747    Visit Number  5    Number of Visits  10    Date for PT Re-Evaluation  08/06/19    Authorization Type  [employer]    Authorization - Visit Number  5    Authorization - Number of Visits  10    Progress Note Due on Visit  10    PT Start Time  1630    PT Stop Time  1730    PT Time Calculation (min)  60 min    Activity Tolerance  Patient tolerated treatment well;No increased pain    Behavior During Therapy  WFL for tasks assessed/performed       Past Medical History:  Diagnosis Date  . Anxiety   . IBS (irritable bowel syndrome)     Past Surgical History:  Procedure Laterality Date  . BREAST REDUCTION SURGERY Bilateral 09/2018  . HYMENECTOMY  11/2016    There were no vitals filed for this visit.    Pelvic Floor Physical Therapy Treatment Note  SCREENING  Changes in medications, allergies, or medical history?: Started an antibiotic on Monday.    SUBJECTIVE  Patient reports: Still having stress with her friend, had to step away from it a lot over the last 2 weeks. Is struggling with the relationship and figuring out her emotions around it. Physically is doing pretty good. Less pain overall but still feeling pain at B ASIS. Stomach has been feeling pretty good.  Precautions:   Anxiety, prior hymenectomy, LLD,    Sexual activity/pain: H/o pain with both initial penetration and deeper thrusting.  Location of pain: lower abdomen, hip-flexors  Current pain: 0/10 (2/10) Max pain: 2/10 (5/10) Least pain: 0/10 (0/10) Nature of pain:achy, shooting, stabbing  **no pain and feels "looser" after treatment.  Patient Goals: Wants to be able to have sex without pain and get more comfortable with her body. alleviate stomach issues.   OBJECTIVE  Changes in: Posture/Observations:  Hyperlordosis  Range of Motion/Flexibilty:  Decreased hip EXT and hip ER/ABD ROM B Strength/MMT:  LE MMT:  Pelvic floor:  Abdominal:   Palpation: TTP to B pectineus and adductor brevis with referred urge to have a BM sensation into the rectum.  Gait Analysis:  INTERVENTIONS THIS SESSION: Manual: Performed TP release  to B pectineus and adductor brevis to decrease spasm and pain and allow for improved balance of musculature for improved function and decreased symptoms.  Therex: Reviewed and practiced butterfly adductor stretch To maintain and improve muscle length and allow for improved balance of musculature for long-term symptom relief.  Total time: 60 min.                              PT Short Term Goals - 05/28/19 1328      PT SHORT TERM GOAL #1  Title  Patient will demonstrate a coordinated contraction, relaxation, and bulge of the pelvic floor muscles to demonstrate functional recruitment and motion and allow for further strengthening.    Baseline  Pt. Hx. of severe constipation indicating likely poor coordination/relaxation    Time  5    Period  Weeks    Status  New    Target Date  07/02/19      PT SHORT TERM GOAL #2   Title  Pt. will demonstrate decreased FHP and hyperkyphosis to allow for down-regulation of the nervous system and decreased pain    Baseline  Pt. responding very slowly to prior treatment, high sensitivity and  anxiety.    Time  5    Period  Weeks    Status  New    Target Date  07/02/19      PT SHORT TERM GOAL #3   Title  Pt. will demonstrate reduced rib-flare and activation of deep-core musculature with functional activity and exercise to allow for further symptom reduction.    Baseline  Decreased rib mobility, difficulty recruiting deep-core and rib flare/hyperlordosis.    Time  6    Period  Weeks    Status  New    Target Date  07/10/18      PT SHORT TERM GOAL #4   Title  Patient will demonstrate improved pelvic alignment and balance of musculature surrounding the pelvis to facilitate decreased PFM spasms and decrease pelvic pain.        PT Long Term Goals - 05/28/19 1317      PT LONG TERM GOAL #1   Title  Patient will report no pain with intercourse to demonstrate improved functional ability.    Baseline  Pain both with initial penetration and with deeper thrusting    Time  10    Period  Weeks    Status  New    Target Date  08/06/19      PT LONG TERM GOAL #2   Title  Patient will report having BM's at least every-other day with consistency between [facility] stool scale 3-5 over the prior week without use of medication to regulate to demonstrate decreased constipation.    Baseline  Pt. has IBS and recently had severe constipation, is now having liquid BM passing around the hardened stool.    Time  10    Period  Weeks    Status  New    Target Date  08/06/19      PT LONG TERM GOAL #3   Title  Pt. will improve in FOTO score by 10 points to demonstrate improved function.    Baseline  FOTO PFDI pain: 21%    Time  10    Period  Weeks    Status  New    Target Date  08/06/19      PT LONG TERM GOAL #4   Title  Patient will demonstrate improved sitting and standing posture to demonstrate learning and decrease stress on the pelvic floor with functional activity.    Baseline  hyperkyphosis, FHP, very mild L thoracic scoliosis (possibly 2/2 LLD)    Time  10    Period  Weeks    Status   New    Target Date  08/06/19      PT LONG TERM GOAL #5   Title  Pt. will demonstrate ability to maintain decreased Sx across 2 weeks using stress management techniques, diet, and HEP independently to demonstrate improved function and condition management strategies.  Baseline  Pt. seeks care when in school and at home but continues to have interruptions to her care and is not yet IND with symptom management strategies.    Time  10    Period  Weeks    Status  New    Target Date  08/06/19            Plan - 06/25/19 1748    Clinical Impression Statement  Pt. Responded well to all interventions today, demonstrating decreased pectineus and adductor brevis spasm and TTP, improved hip ER/ABD ROM in supine, as well as understanding and correct performance of all education and exercises provided today. They will continue to benefit from skilled physical therapy to work toward remaining goals and maximize function as well as decrease likelihood of symptom increase or recurrence.     Personal Factors and Comorbidities  Comorbidity 2    Comorbidities  High anxiety and IBS    Examination-Activity Limitations  Toileting    Examination-Participation Restrictions  Interpersonal Relationship;School;Driving    Stability/Clinical Decision Making  Evolving/Moderate complexity    Rehab Potential  Good    Clinical Impairments Affecting Rehab Potential  IBS, Anxiety    PT Frequency  1x / week    PT Duration  Other (comment)   10 weeks   PT Treatment/Interventions  ADLs/Self Care Home Management;Biofeedback;Moist Heat;Electrical Stimulation;Ultrasound;Therapeutic activities;Functional mobility training;Gait training;Therapeutic exercise;Neuromuscular re-education;Patient/family education;Manual techniques;Scar mobilization;Taping;Dry needling;Passive range of motion;Spinal Manipulations;Joint Manipulations    PT Next Visit Plan  re-assess pectineus, re-assess T-L junction and  traction PRN and  review/re-add chin-tucks and scap retractions. Give progressive relaxation techniques    PT Home Exercise Plan  colonic massage, self-spinting/disimpact, chin-tucks, scapular retractions, hip-flexor stretch, butterfly stretch    Consulted and Agree with Plan of Care  Patient       Patient will benefit from skilled therapeutic intervention in order to improve the following deficits and impairments:  Decreased mobility, Increased muscle spasms, Decreased range of motion, Decreased scar mobility, Impaired tone, Improper body mechanics, Decreased activity tolerance, Decreased coordination, Increased fascial restricitons, Impaired flexibility, Postural dysfunction, Pain  Visit Diagnosis: Other muscle spasm  Leg length discrepancy  Abnormal posture  Other idiopathic scoliosis, thoracic region     Problem List Patient Active Problem List   Diagnosis Date Noted  . Anxiety 02/21/2017   Cleophus Molt DPT, ATC Cleophus Molt 06/25/2019, 5:51 PM  [place] [facility] MAIN [facility] SERVICES [address] [place], [place], [address] Phone: [phone]   Fax:  [phone]  Name: Judy Williamson MRN: [id number] Date of Birth: Feb 11, 2001

## 2019-07-02 ENCOUNTER — Other Ambulatory Visit: Payer: Self-pay

## 2019-07-02 ENCOUNTER — Ambulatory Visit: Payer: BC Managed Care – PPO

## 2019-07-02 DIAGNOSIS — M4124 Other idiopathic scoliosis, thoracic region: Secondary | ICD-10-CM

## 2019-07-02 DIAGNOSIS — M62838 Other muscle spasm: Secondary | ICD-10-CM | POA: Diagnosis not present

## 2019-07-02 DIAGNOSIS — M2170 Unequal limb length (acquired), unspecified site: Secondary | ICD-10-CM

## 2019-07-02 DIAGNOSIS — R293 Abnormal posture: Secondary | ICD-10-CM

## 2019-07-02 NOTE — Therapy (Signed)
[place] MAIN [facility] SERVICES [address] [place], [place], [address] Phone: [phone]   Fax:  [phone]  Physical Therapy Treatment  The patient has been informed of current processes in place at [facility]patient Rehab to protect patients from Covid-19 exposure including social distancing, schedule modifications, and new cleaning procedures. After discussing their particular risk with a therapist based on the patient's personal risk factors, the patient has decided to proceed with in-person therapy.   Patient Details  Name: Judy Williamson MRN: [id number] Date of Birth: [birth year] No data recorded  Encounter Date: 07/02/2019  PT End of Session - 07/04/19 1024    Visit Number  6    Number of Visits  10    Date for PT Re-Evaluation  08/06/19    Authorization Type  BCBS    Authorization - Visit Number  6    Authorization - Number of Visits  10    Progress Note Due on Visit  10    PT Start Time  1630    PT Stop Time  1730    PT Time Calculation (min)  60 min    Activity Tolerance  Patient tolerated treatment well;No increased pain    Behavior During Therapy  WFL for tasks assessed/performed       Past Medical History:  Diagnosis Date  . Anxiety   . IBS (irritable bowel syndrome)     Past Surgical History:  Procedure Laterality Date  . BREAST REDUCTION SURGERY Bilateral 09/2018  . HYMENECTOMY  11/2016    There were no vitals filed for this visit.   Pelvic Floor Physical Therapy Treatment Note  SCREENING  Changes in medications, allergies, or medical history?: Started an antibiotic on Monday.    SUBJECTIVE  Patient reports: She got ambitious with her dilators this week and worked up to the purple dilators. Noticed that with deep penetration with the dilator she can feel pain in her hip-flexors. The antibiotics have really been helping with her GI system, having more formed BM's and emptying better.    Left hip still has been  getting "stuck" feeling in her L hip  Precautions:  Anxiety, prior hymenectomy, LLD,    Sexual activity/pain: H/o pain with both initial penetration and deeper thrusting.  Location of pain: lower abdomen, hip-flexors  Current pain: 0/10 (2/10) Max pain: 2/10 (5/10) Least pain: 0/10 (0/10) Nature of pain:achy, shooting, stabbing  **slightly achy from treatment  Patient Goals: Wants to be able to have sex without pain and get more comfortable with her body. alleviate stomach issues.   OBJECTIVE  Changes in: Posture/Observations:  Hyperlordosis  Range of Motion/Flexibilty:  Decreased hip EXT and hip ER/ABD ROM B Strength/MMT:  LE MMT:   Pelvic Floor External Exam: Introitus Appears: elevated Skin integrity: WNL  Palpation: not assessed Cough: not assessed Prolapse visible?: no Scar mobility: N/A  Internal Vaginal Exam: Strength (PERF): WNL Symmetry: symmetrical Palpation: TTP throughout Prolapse: No  Abdominal:   Palpation: TTP to B pectineus and adductor brevis with referred urge to have a BM sensation into the rectum.(from prior session)  Gait Analysis:  INTERVENTIONS THIS SESSION: Manual: Performed internal assessment and TP release to B posterior PR/PC and coccygeus to decrease spasm and pain and allow for improved balance of musculature for improved function and decreased symptoms.   Total time: 60 min.  PT Short Term Goals - 05/28/19 1328      PT SHORT TERM GOAL #1   Title  Patient will demonstrate a coordinated contraction, relaxation, and bulge of the pelvic floor muscles to demonstrate functional recruitment and motion and allow for further strengthening.    Baseline  Pt. Hx. of severe constipation indicating likely poor coordination/relaxation    Time  5    Period  Weeks    Status  New    Target Date  07/02/19      PT SHORT TERM GOAL #2   Title  Pt. will demonstrate decreased FHP  and hyperkyphosis to allow for down-regulation of the nervous system and decreased pain    Baseline  Pt. responding very slowly to prior treatment, high sensitivity and anxiety.    Time  5    Period  Weeks    Status  New    Target Date  07/02/19      PT SHORT TERM GOAL #3   Title  Pt. will demonstrate reduced rib-flare and activation of deep-core musculature with functional activity and exercise to allow for further symptom reduction.    Baseline  Decreased rib mobility, difficulty recruiting deep-core and rib flare/hyperlordosis.    Time  6    Period  Weeks    Status  New    Target Date  07/10/18      PT SHORT TERM GOAL #4   Title  Patient will demonstrate improved pelvic alignment and balance of musculature surrounding the pelvis to facilitate decreased PFM spasms and decrease pelvic pain.        PT Long Term Goals - 05/28/19 1317      PT LONG TERM GOAL #1   Title  Patient will report no pain with intercourse to demonstrate improved functional ability.    Baseline  Pain both with initial penetration and with deeper thrusting    Time  10    Period  Weeks    Status  New    Target Date  08/06/19      PT LONG TERM GOAL #2   Title  Patient will report having BM's at least every-other day with consistency between [facility] stool scale 3-5 over the prior week without use of medication to regulate to demonstrate decreased constipation.    Baseline  Pt. has IBS and recently had severe constipation, is now having liquid BM passing around the hardened stool.    Time  10    Period  Weeks    Status  New    Target Date  08/06/19      PT LONG TERM GOAL #3   Title  Pt. will improve in FOTO score by 10 points to demonstrate improved function.    Baseline  FOTO PFDI pain: 21%    Time  10    Period  Weeks    Status  New    Target Date  08/06/19      PT LONG TERM GOAL #4   Title  Patient will demonstrate improved sitting and standing posture to demonstrate learning and decrease stress on  the pelvic floor with functional activity.    Baseline  hyperkyphosis, FHP, very mild L thoracic scoliosis (possibly 2/2 LLD)    Time  10    Period  Weeks    Status  New    Target Date  08/06/19      PT LONG TERM GOAL #5   Title  Pt. will demonstrate ability to maintain decreased Sx across  2 weeks using stress management techniques, diet, and HEP independently to demonstrate improved function and condition management strategies.    Baseline  Pt. seeks care when in school and at home but continues to have interruptions to her care and is not yet IND with symptom management strategies.    Time  10    Period  Weeks    Status  New    Target Date  08/06/19            Plan - 07/04/19 1025    Clinical Impression Statement  Pt. Responded well to all interventions today, demonstrating improved spasm reduction and tolerance of pressure internally with 3 muscles releasing on each side which is a great improvement from past sessions where 1-2 muscles total could be released within a session. Pt. Also reports ability to use second-to larges dilator without great discomfort for the first time and today demonstrated understanding and correct performance of all education and exercises provided today. They will continue to benefit from skilled physical therapy to work toward remaining goals and maximize function as well as decrease likelihood of symptom increase or recurrence.     Personal Factors and Comorbidities  Comorbidity 2    Comorbidities  High anxiety and IBS    Examination-Activity Limitations  Toileting    Examination-Participation Restrictions  Interpersonal Relationship;School;Driving    Stability/Clinical Decision Making  Evolving/Moderate complexity    Rehab Potential  Good    Clinical Impairments Affecting Rehab Potential  IBS, Anxiety    PT Frequency  1x / week    PT Duration  Other (comment)   10 weeks   PT Treatment/Interventions  ADLs/Self Care Home Management;Biofeedback;Moist  Heat;Electrical Stimulation;Ultrasound;Therapeutic activities;Functional mobility training;Gait training;Therapeutic exercise;Neuromuscular re-education;Patient/family education;Manual techniques;Scar mobilization;Taping;Dry needling;Passive range of motion;Spinal Manipulations;Joint Manipulations    PT Next Visit Plan  Further internal TP release to anterior>posterior, re-assess pectineus, re-assess T-L junction and  traction PRN and review/re-add chin-tucks and scap retractions. Give progressive relaxation techniques    PT Home Exercise Plan  colonic massage, self-spinting/disimpact, chin-tucks, scapular retractions, hip-flexor stretch, butterfly stretch    Consulted and Agree with Plan of Care  Patient       Patient will benefit from skilled therapeutic intervention in order to improve the following deficits and impairments:  Decreased mobility, Increased muscle spasms, Decreased range of motion, Decreased scar mobility, Impaired tone, Improper body mechanics, Decreased activity tolerance, Decreased coordination, Increased fascial restricitons, Impaired flexibility, Postural dysfunction, Pain  Visit Diagnosis: Other muscle spasm  Leg length discrepancy  Abnormal posture  Other idiopathic scoliosis, thoracic region     Problem List Patient Active Problem List   Diagnosis Date Noted  . Anxiety 02/21/2017   Cleophus Molt DPT, ATC Cleophus Molt 07/04/2019, 10:26 AM  [facility] MAIN [facility] SERVICES [address] [place], [place], [address] Phone: [phone]   Fax:  [phone]  Name: Denese Mentink MRN: [id number] Date of Birth: 08/16/2000

## 2019-07-09 ENCOUNTER — Other Ambulatory Visit: Payer: Self-pay

## 2019-07-09 ENCOUNTER — Ambulatory Visit: Payer: BC Managed Care – PPO

## 2019-07-09 DIAGNOSIS — M2170 Unequal limb length (acquired), unspecified site: Secondary | ICD-10-CM

## 2019-07-09 DIAGNOSIS — M62838 Other muscle spasm: Secondary | ICD-10-CM | POA: Diagnosis not present

## 2019-07-09 DIAGNOSIS — M4124 Other idiopathic scoliosis, thoracic region: Secondary | ICD-10-CM

## 2019-07-09 DIAGNOSIS — R293 Abnormal posture: Secondary | ICD-10-CM

## 2019-07-09 NOTE — Therapy (Signed)
[place] [facility] MAIN [facility] SERVICES [address] [place], [place], [address] Phone: [phone]   Fax:  [phone]  Physical Therapy Treatment  The patient has been informed of current processes in place at Outpatient Rehab to protect patients from Covid-19 exposure including social distancing, schedule modifications, and new cleaning procedures. After discussing their particular risk with a therapist based on the patient's personal risk factors, the patient has decided to proceed with in-person therapy.   Patient Details  Name: Judy Williamson MRN: [id number] Date of Birth: [birth year] No data recorded  Encounter Date: 07/09/2019  PT End of Session - 07/09/19 1747    Visit Number  7    Number of Visits  10    Date for PT Re-Evaluation  08/06/19    Authorization Type  BCBS    Authorization - Visit Number  7    Authorization - Number of Visits  10    Progress Note Due on Visit  10    PT Start Time  1630    PT Stop Time  1730    PT Time Calculation (min)  60 min    Activity Tolerance  Patient tolerated treatment well;No increased pain    Behavior During Therapy  WFL for tasks assessed/performed       Past Medical History:  Diagnosis Date  . Anxiety   . IBS (irritable bowel syndrome)     Past Surgical History:  Procedure Laterality Date  . BREAST REDUCTION SURGERY Bilateral 09/2018  . HYMENECTOMY  11/2016    There were no vitals filed for this visit.   Pelvic Floor Physical Therapy Treatment Note  SCREENING  Changes in medications, allergies, or medical history?: Pt. Did not use vaginal valium before today's treatment session)  SUBJECTIVE  Patient reports: Has been a rough week emotionally/with stress. "feels like she is on a hamster wheel that wont stop" feel like she is having period cramps, hip has been catching sometimes but not as bad or often. Has not been dilating since last visit. Has been having fuller poops, still having  gas bubbles. Pooped 3 times yesterday, once today.  Precautions:  Anxiety, prior hymenectomy, LLD,    Sexual activity/pain: H/o pain with both initial penetration and deeper thrusting.  Location of pain: lower abdomen, hip-flexors  Current pain: 0/10 (0/10) Max pain: 4/10 (4/10) Least pain: 0/10 (0/10) Nature of pain:achy, shooting, stabbing  **no increased pain, Pt. Described some "numbness" in her feet upon first sitting up (likely positional pressure)  Patient Goals: Wants to be able to have sex without pain and get more comfortable with her body. alleviate stomach issues.   OBJECTIVE  Changes in: Posture/Observations:  Hyperlordosis  Range of Motion/Flexibilty:  Decreased hip EXT and hip ER/ABD ROM B Strength/MMT:  LE MMT:   Pelvic Floor (from 3/17-21) External Exam: Introitus Appears: elevated Skin integrity: WNL  Palpation: not assessed Cough: not assessed Prolapse visible?: no Scar mobility: N/A  Internal Vaginal Exam: Strength (PERF): WNL Symmetry: symmetrical Palpation: TTP throughout Prolapse: No  TODAY: ~1 inch thick band of spasm at L posterior PR/PC and highly TTP at anterior PR/PC.  Abdominal:   Palpation: TTP to B pectineus and adductor brevis with referred urge to have a BM sensation into the rectum.(from prior session)  Gait Analysis:  INTERVENTIONS THIS SESSION: Manual: Performed TP release to L PR/PC both anteriorly and posteriorly near introitus to decrease spasm and pain, take pressure off of nerve bundle, and allow for improved balance of musculature  for improved function and decreased symptoms.  Self-care: reviewed optimal frequency of dilator use, encouraged to use this week to help maintain relaxation gained near introitus.   Total time: 60 min.                              PT Short Term Goals - 05/28/19 1328      PT SHORT TERM GOAL #1   Title  Patient will demonstrate a coordinated  contraction, relaxation, and bulge of the pelvic floor muscles to demonstrate functional recruitment and motion and allow for further strengthening.    Baseline  Pt. Hx. of severe constipation indicating likely poor coordination/relaxation    Time  5    Period  Weeks    Status  New    Target Date  07/02/19      PT SHORT TERM GOAL #2   Title  Pt. will demonstrate decreased FHP and hyperkyphosis to allow for down-regulation of the nervous system and decreased pain    Baseline  Pt. responding very slowly to prior treatment, high sensitivity and anxiety.    Time  5    Period  Weeks    Status  New    Target Date  07/02/19      PT SHORT TERM GOAL #3   Title  Pt. will demonstrate reduced rib-flare and activation of deep-core musculature with functional activity and exercise to allow for further symptom reduction.    Baseline  Decreased rib mobility, difficulty recruiting deep-core and rib flare/hyperlordosis.    Time  6    Period  Weeks    Status  New    Target Date  07/10/18      PT SHORT TERM GOAL #4   Title  Patient will demonstrate improved pelvic alignment and balance of musculature surrounding the pelvis to facilitate decreased PFM spasms and decrease pelvic pain.        PT Long Term Goals - 05/28/19 1317      PT LONG TERM GOAL #1   Title  Patient will report no pain with intercourse to demonstrate improved functional ability.    Baseline  Pain both with initial penetration and with deeper thrusting    Time  10    Period  Weeks    Status  New    Target Date  08/06/19      PT LONG TERM GOAL #2   Title  Patient will report having BM's at least every-other day with consistency between [facility] stool scale 3-5 over the prior week without use of medication to regulate to demonstrate decreased constipation.    Baseline  Pt. has IBS and recently had severe constipation, is now having liquid BM passing around the hardened stool.    Time  10    Period  Weeks    Status  New    Target  Date  08/06/19      PT LONG TERM GOAL #3   Title  Pt. will improve in FOTO score by 10 points to demonstrate improved function.    Baseline  FOTO PFDI pain: 21%    Time  10    Period  Weeks    Status  New    Target Date  08/06/19      PT LONG TERM GOAL #4   Title  Patient will demonstrate improved sitting and standing posture to demonstrate learning and decrease stress on the pelvic floor with functional activity.  Baseline  hyperkyphosis, FHP, very mild L thoracic scoliosis (possibly 2/2 LLD)    Time  10    Period  Weeks    Status  New    Target Date  08/06/19      PT LONG TERM GOAL #5   Title  Pt. will demonstrate ability to maintain decreased Sx across 2 weeks using stress management techniques, diet, and HEP independently to demonstrate improved function and condition management strategies.    Baseline  Pt. seeks care when in school and at home but continues to have interruptions to her care and is not yet IND with symptom management strategies.    Time  10    Period  Weeks    Status  New    Target Date  08/06/19            Plan - 07/09/19 1748    Clinical Impression Statement  Pt. Responded well to all interventions today, demonstrating decreased PFM spasm and TTP, as well as understanding and correct performance of all education and exercises provided today. They will continue to benefit from skilled physical therapy to work toward remaining goals and maximize function as well as decrease likelihood of symptom increase or recurrence.     PT Next Visit Plan  Further internal TP release to R>L, anterior>posterior, re-assess pectineus, re-assess T-L junction and  traction PRN and review/re-add chin-tucks and scap retractions. Give progressive relaxation techniques    PT Home Exercise Plan  colonic massage, self-spinting/disimpact, chin-tucks, scapular retractions, hip-flexor stretch, butterfly stretch, dilator frequency    Consulted and Agree with Plan of Care  Patient        Patient will benefit from skilled therapeutic intervention in order to improve the following deficits and impairments:     Visit Diagnosis: Other muscle spasm  Leg length discrepancy  Abnormal posture  Other idiopathic scoliosis, thoracic region     Problem List Patient Active Problem List   Diagnosis Date Noted  . Anxiety 02/21/2017   Willa Rough DPT, ATC Willa Rough 07/09/2019, 5:49 PM  [place] MAIN [facility] SERVICES [address] [place], [place], [address] Phone: [phone]   Fax:  [phone]  Name: Damien Cisar MRN: [id number] Date of Birth: 2000/10/18

## 2019-07-16 ENCOUNTER — Ambulatory Visit: Payer: BC Managed Care – PPO

## 2019-07-16 ENCOUNTER — Other Ambulatory Visit: Payer: Self-pay

## 2019-07-16 DIAGNOSIS — M62838 Other muscle spasm: Secondary | ICD-10-CM

## 2019-07-16 DIAGNOSIS — M4124 Other idiopathic scoliosis, thoracic region: Secondary | ICD-10-CM

## 2019-07-16 DIAGNOSIS — R293 Abnormal posture: Secondary | ICD-10-CM

## 2019-07-16 DIAGNOSIS — M2170 Unequal limb length (acquired), unspecified site: Secondary | ICD-10-CM

## 2019-07-16 NOTE — Therapy (Addendum)
[facility] MAIN [facility] SERVICES [address] [place], [place], [address] Phone: [phone]   Fax:  [phone]  Physical Therapy Treatment  The patient has been informed of current processes in place at Outpatient Rehab to protect patients from Covid-19 exposure including social distancing, schedule modifications, and new cleaning procedures. After discussing their particular risk with a therapist based on the patient's personal risk factors, the patient has decided to proceed with in-person therapy.   Patient Details  Name: Judy Williamson MRN: [id number] Date of Birth: 27-Jan-2001 No data recorded  Encounter Date: 07/16/2019  PT End of Session - 07/23/19 1458    Visit Number  8    Number of Visits  10    Date for PT Re-Evaluation  08/06/19    Authorization Type  BCBS    Authorization - Visit Number  8    Authorization - Number of Visits  10    Progress Note Due on Visit  10    PT Start Time  1630    PT Stop Time  1730    PT Time Calculation (min)  60 min    Activity Tolerance  Patient tolerated treatment well;No increased pain    Behavior During Therapy  WFL for tasks assessed/performed       Past Medical History:  Diagnosis Date  . Anxiety   . IBS (irritable bowel syndrome)     Past Surgical History:  Procedure Laterality Date  . BREAST REDUCTION SURGERY Bilateral 09/2018  . HYMENECTOMY  11/2016    There were no vitals filed for this visit.    Pelvic Floor Physical Therapy Treatment Note  SCREENING  Changes in medications, allergies, or medical history?: Pt. Did not use vaginal valium before today's treatment session)  SUBJECTIVE  Patient reports: Has a pin in her neck/upper shoulders that started 2 days ago and is "really terrible". Has noticed a little more tightness in the hip-flexors/lower abdomen. Neck pain is 6/10.  Precautions:  Anxiety, prior hymenectomy, LLD,    Sexual activity/pain: H/o pain with both  initial penetration and deeper thrusting.  Location of pain: lower abdomen, hip-flexors  Current pain: 2/10 (6/10) Max pain: 4/10 (8/10) Least pain: 0/10 (0/10) Nature of pain:achy, shooting, stabbing  Patient Goals: Wants to be able to have sex without pain and get more comfortable with her body. alleviate stomach issues.   OBJECTIVE  Changes in: Posture/Observations:  Hyperlordosis, FHP with hidden C5-C6  Range of Motion/Flexibilty:  Decreased hip EXT and hip ER/ABD ROM B Strength/MMT:  LE MMT:   Pelvic Floor (from 3/17-21) External Exam: Introitus Appears: elevated Skin integrity: WNL  Palpation: not assessed Cough: not assessed Prolapse visible?: no Scar mobility: N/A  Internal Vaginal Exam: Strength (PERF): WNL Symmetry: symmetrical Palpation: TTP throughout Prolapse: No  Abdominal:   Palpation: TTP to B pectineus and adductor brevis with referred urge to have a BM sensation into the rectum.(from prior session)  Today: TTP to splenius capitus, and multifidus near C6-7  Gait Analysis:  INTERVENTIONS THIS SESSION: Manual: Performed STM and TP release to splenius capitus, and multifidus near C6-7 followed by PA mobs to C7, and manual cervical traction to decrease pain and spasm and allow for improved head position to offload cervical musculature and decompress nerve roots.   Self-care: educated on seated posture for school, using a small stool under her feet, and how to find pelvic neutral to improve seated posture in class and while on computer to prevent return of spasm/stiffness, pain  and poor posture that is effecting lumbar and sacral alignement.   Therex: reviewed chin-tucks and scapular retractions to improve strength of muscles opposing tight musculature to allow reciprocal inhibition to improve balance of musculature surrounding the neck and shoulders and improve overall posture for optimal musculature length-tension relationship and  function. .  Total time: 60 min.                           PT Short Term Goals - 05/28/19 1328      PT SHORT TERM GOAL #1   Title  Patient will demonstrate a coordinated contraction, relaxation, and bulge of the pelvic floor muscles to demonstrate functional recruitment and motion and allow for further strengthening.    Baseline  Pt. Hx. of severe constipation indicating likely poor coordination/relaxation    Time  5    Period  Weeks    Status  New    Target Date  07/02/19      PT SHORT TERM GOAL #2   Title  Pt. will demonstrate decreased FHP and hyperkyphosis to allow for down-regulation of the nervous system and decreased pain    Baseline  Pt. responding very slowly to prior treatment, high sensitivity and anxiety.    Time  5    Period  Weeks    Status  New    Target Date  07/02/19      PT SHORT TERM GOAL #3   Title  Pt. will demonstrate reduced rib-flare and activation of deep-core musculature with functional activity and exercise to allow for further symptom reduction.    Baseline  Decreased rib mobility, difficulty recruiting deep-core and rib flare/hyperlordosis.    Time  6    Period  Weeks    Status  New    Target Date  07/10/18      PT SHORT TERM GOAL #4   Title  Patient will demonstrate improved pelvic alignment and balance of musculature surrounding the pelvis to facilitate decreased PFM spasms and decrease pelvic pain.        PT Long Term Goals - 05/28/19 1317      PT LONG TERM GOAL #1   Title  Patient will report no pain with intercourse to demonstrate improved functional ability.    Baseline  Pain both with initial penetration and with deeper thrusting    Time  10    Period  Weeks    Status  New    Target Date  08/06/19      PT LONG TERM GOAL #2   Title  Patient will report having BM's at least every-other day with consistency between [facility] stool scale 3-5 over the prior week without use of medication to regulate to demonstrate  decreased constipation.    Baseline  Pt. has IBS and recently had severe constipation, is now having liquid BM passing around the hardened stool.    Time  10    Period  Weeks    Status  New    Target Date  08/06/19      PT LONG TERM GOAL #3   Title  Pt. will improve in FOTO score by 10 points to demonstrate improved function.    Baseline  FOTO PFDI pain: 21%    Time  10    Period  Weeks    Status  New    Target Date  08/06/19      PT LONG TERM GOAL #4   Title  Patient  will demonstrate improved sitting and standing posture to demonstrate learning and decrease stress on the pelvic floor with functional activity.    Baseline  hyperkyphosis, FHP, very mild L thoracic scoliosis (possibly 2/2 LLD)    Time  10    Period  Weeks    Status  New    Target Date  08/06/19      PT LONG TERM GOAL #5   Title  Pt. will demonstrate ability to maintain decreased Sx across 2 weeks using stress management techniques, diet, and HEP independently to demonstrate improved function and condition management strategies.    Baseline  Pt. seeks care when in school and at home but continues to have interruptions to her care and is not yet IND with symptom management strategies.    Time  10    Period  Weeks    Status  New    Target Date  08/06/19            Plan - 07/23/19 1458    Clinical Impression Statement  Pt. Responded well to all interventions today, demonstrating decreased spasm and TTP, improved cervical mobility, postural awareness, as well as understanding and correct performance of all education and exercises provided today. They will continue to benefit from skilled physical therapy to work toward remaining goals and maximize function as well as decrease likelihood of symptom increase or recurrence.     PT Next Visit Plan  TP release to anterior shoulder/pecs, Further internal TP release to R>L, anterior>posterior, re-assess pectineus, re-assess T-L junction and  traction PRN and review  chin-tucks and scap retractions. Give progressive relaxation techniques, cervical traction.    PT Home Exercise Plan  colonic massage, self-spinting/disimpact, chin-tucks, scapular retractions, hip-flexor stretch, butterfly stretch, dilator frequency, seated posture,    Consulted and Agree with Plan of Care  Patient       Patient will benefit from skilled therapeutic intervention in order to improve the following deficits and impairments:     Visit Diagnosis: Other muscle spasm  Leg length discrepancy  Abnormal posture  Other idiopathic scoliosis, thoracic region     Problem List Patient Active Problem List   Diagnosis Date Noted  . Anxiety 02/21/2017   Willa Rough DPT, ATC Willa Rough 07/23/2019, 3:09 PM  [place] MAIN [facility] SERVICES [address] [place], [place], [address] Phone: [phone]   Fax:  [phone]  Name: Judy Williamson MRN: [id number] Date of Birth: [birth year]

## 2019-07-16 NOTE — Patient Instructions (Signed)
  When seated, you want to maintain pelvic neutral with the shoulders gently down and back and ears in line with your shoulders. A lumbar roll such as the one below or a home-made towel-roll can be used for this purpose. Even Olympic athletes can only maintain proper seated posture for about 10 minutes without support!    Pictured: The Original [employer] Lumbar Roll  

## 2019-07-23 ENCOUNTER — Other Ambulatory Visit: Payer: Self-pay

## 2019-07-23 ENCOUNTER — Ambulatory Visit: Payer: BC Managed Care – PPO | Attending: Obstetrics & Gynecology

## 2019-07-23 DIAGNOSIS — M62838 Other muscle spasm: Secondary | ICD-10-CM | POA: Insufficient documentation

## 2019-07-23 DIAGNOSIS — M4124 Other idiopathic scoliosis, thoracic region: Secondary | ICD-10-CM | POA: Insufficient documentation

## 2019-07-23 DIAGNOSIS — R293 Abnormal posture: Secondary | ICD-10-CM | POA: Insufficient documentation

## 2019-07-23 DIAGNOSIS — M2170 Unequal limb length (acquired), unspecified site: Secondary | ICD-10-CM | POA: Diagnosis present

## 2019-07-23 NOTE — Therapy (Signed)
[facility] MAIN [facility] SERVICES [address] [place], [place], [address] Phone: [phone]   Fax:  [phone]  Physical Therapy Treatment  The patient has been informed of current processes in place at Outpatient Rehab to protect patients from Covid-19 exposure including social distancing, schedule modifications, and new cleaning procedures. After discussing their particular risk with a therapist based on the patient's personal risk factors, the patient has decided to proceed with in-person therapy.   Patient Details  Name: Judy Williamson MRN: [id number] Date of Birth: [birth year] No data recorded  Encounter Date: 07/23/2019  PT End of Session - 07/25/19 0756    Visit Number  9    Number of Visits  10    Date for PT Re-Evaluation  08/06/19    Authorization Type  BCBS    Authorization - Visit Number  9    Authorization - Number of Visits  10    Progress Note Due on Visit  10    PT Start Time  1645    PT Stop Time  1735    PT Time Calculation (min)  50 min    Activity Tolerance  Patient tolerated treatment well;No increased pain    Behavior During Therapy  WFL for tasks assessed/performed       Past Medical History:  Diagnosis Date  . Anxiety   . IBS (irritable bowel syndrome)     Past Surgical History:  Procedure Laterality Date  . BREAST REDUCTION SURGERY Bilateral 09/2018  . HYMENECTOMY  11/2016    There were no vitals filed for this visit.   Pelvic Floor Physical Therapy Treatment Note  SCREENING  Changes in medications, allergies, or medical history?:   SUBJECTIVE  Patient reports: Had her covid vaccine on Saturday. Upper back is still not great but definitely better, lower back has been hurting more. Dilated 2 times since last visit. Tried yesterday and it was "awefull"   Precautions:  Anxiety, prior hymenectomy, LLD,    Sexual activity/pain: H/o pain with both initial penetration and deeper thrusting.  Location  of pain: , hip-flexors  Current pain: 3/10 (6/10) Max pain: 4/10 (8/10) Least pain: 0/10 (0/10) Nature of pain:achy, shooting, stabbing  Patient Goals: Wants to be able to have sex without pain and get more comfortable with her body. alleviate stomach issues.   OBJECTIVE  Changes in: Posture/Observations:  Hyperlordosis, FHP with hidden C5-C6  Range of Motion/Flexibilty:  Decreased ability to achieve upright posture through thoracic spine due to [facility] and anterior chest tightness.   Strength/MMT:  LE MMT:   Pelvic Floor (from 3/17-21) [External Exam: Introitus Appears: elevated Skin integrity: WNL  Palpation: not assessed Cough: not assessed Prolapse visible?: no Scar mobility: N/A  Internal Vaginal Exam: Strength (PERF): WNL Symmetry: symmetrical Palpation: TTP throughout Prolapse: No]  Abdominal:   Palpation: TTP to B pectineus and adductor brevis with referred urge to have a BM sensation into the rectum.(from prior session)  Today: TTP to B pec Maj, Minor, subscap   Gait Analysis:  INTERVENTIONS THIS SESSION: Manual: Performed STM and TP release to R>L pec Maj, Minor, subscap to decrease pain and spasm and allow for improved scapular retraction ROM and head position to offload cervical musculature and decompress nerve roots.    Total time: 50 min.                                PT Short Term Goals -  05/28/19 1328      PT SHORT TERM GOAL #1   Title  Patient will demonstrate a coordinated contraction, relaxation, and bulge of the pelvic floor muscles to demonstrate functional recruitment and motion and allow for further strengthening.    Baseline  Pt. Hx. of severe constipation indicating likely poor coordination/relaxation    Time  5    Period  Weeks    Status  New    Target Date  07/02/19      PT SHORT TERM GOAL #2   Title  Pt. will demonstrate decreased FHP and hyperkyphosis to allow for down-regulation of the  nervous system and decreased pain    Baseline  Pt. responding very slowly to prior treatment, high sensitivity and anxiety.    Time  5    Period  Weeks    Status  New    Target Date  07/02/19      PT SHORT TERM GOAL #3   Title  Pt. will demonstrate reduced rib-flare and activation of deep-core musculature with functional activity and exercise to allow for further symptom reduction.    Baseline  Decreased rib mobility, difficulty recruiting deep-core and rib flare/hyperlordosis.    Time  6    Period  Weeks    Status  New    Target Date  07/10/18      PT SHORT TERM GOAL #4   Title  Patient will demonstrate improved pelvic alignment and balance of musculature surrounding the pelvis to facilitate decreased PFM spasms and decrease pelvic pain.        PT Long Term Goals - 05/28/19 1317      PT LONG TERM GOAL #1   Title  Patient will report no pain with intercourse to demonstrate improved functional ability.    Baseline  Pain both with initial penetration and with deeper thrusting    Time  10    Period  Weeks    Status  New    Target Date  08/06/19      PT LONG TERM GOAL #2   Title  Patient will report having BM's at least every-other day with consistency between [facility] stool scale 3-5 over the prior week without use of medication to regulate to demonstrate decreased constipation.    Baseline  Pt. has IBS and recently had severe constipation, is now having liquid BM passing around the hardened stool.    Time  10    Period  Weeks    Status  New    Target Date  08/06/19      PT LONG TERM GOAL #3   Title  Pt. will improve in FOTO score by 10 points to demonstrate improved function.    Baseline  FOTO PFDI pain: 21%    Time  10    Period  Weeks    Status  New    Target Date  08/06/19      PT LONG TERM GOAL #4   Title  Patient will demonstrate improved sitting and standing posture to demonstrate learning and decrease stress on the pelvic floor with functional activity.     Baseline  hyperkyphosis, FHP, very mild L thoracic scoliosis (possibly 2/2 LLD)    Time  10    Period  Weeks    Status  New    Target Date  08/06/19      PT LONG TERM GOAL #5   Title  Pt. will demonstrate ability to maintain decreased Sx across 2 weeks using stress management  techniques, diet, and HEP independently to demonstrate improved function and condition management strategies.    Baseline  Pt. seeks care when in school and at home but continues to have interruptions to her care and is not yet IND with symptom management strategies.    Time  10    Period  Weeks    Status  New    Target Date  08/06/19            Plan - 07/25/19 0757    Clinical Impression Statement  Pt. Responded well to all interventions today, demonstrating improved scapular retraction ROM n R and decreased spasms and TTP as well as understanding and correct performance of all education and exercises provided today. They will continue to benefit from skilled physical therapy to work toward remaining goals and maximize function as well as decrease likelihood of symptom increase or recurrence.     PT Next Visit Plan  TP release to L>R anterior shoulder/pecs, Further internal TP release to R>L, anterior>posterior, re-assess pectineus and add brevis, re-assess T-L junction and  traction PRN and review chin-tucks and scap retractions. Give progressive relaxation techniques, cervical traction.    PT Home Exercise Plan  colonic massage, self-spinting/disimpact, chin-tucks, scapular retractions, hip-flexor stretch, butterfly stretch, dilator frequency, seated posture, self TP release,    Consulted and Agree with Plan of Care  Patient       Patient will benefit from skilled therapeutic intervention in order to improve the following deficits and impairments:     Visit Diagnosis: Other muscle spasm  Leg length discrepancy  Abnormal posture  Other idiopathic scoliosis, thoracic region     Problem List Patient  Active Problem List   Diagnosis Date Noted  . Anxiety 02/21/2017   Cleophus Molt DPT, ATC Cleophus Molt 07/25/2019, 7:58 AM  [place] [facility] MAIN [facility] SERVICES [address] [place], [place], [address] Phone: [phone]   Fax:  [phone]  Name: Judy Williamson MRN: [id number] Date of Birth: 11/17/2000

## 2019-07-30 ENCOUNTER — Other Ambulatory Visit: Payer: Self-pay

## 2019-07-30 ENCOUNTER — Ambulatory Visit: Payer: BC Managed Care – PPO

## 2019-07-30 DIAGNOSIS — M4124 Other idiopathic scoliosis, thoracic region: Secondary | ICD-10-CM

## 2019-07-30 DIAGNOSIS — M62838 Other muscle spasm: Secondary | ICD-10-CM | POA: Diagnosis not present

## 2019-07-30 DIAGNOSIS — M2170 Unequal limb length (acquired), unspecified site: Secondary | ICD-10-CM

## 2019-07-30 DIAGNOSIS — R293 Abnormal posture: Secondary | ICD-10-CM

## 2019-07-30 NOTE — Therapy (Signed)
[place] MAIN [facility] SERVICES [address] [place], [place], [address] Phone: [phone]   Fax:  [phone]  Physical Therapy Treatment  The patient has been informed of current processes in place at Outpatient Rehab to protect patients from Covid-19 exposure including social distancing, schedule modifications, and new cleaning procedures. After discussing their particular risk with a therapist based on the patient's personal risk factors, the patient has decided to proceed with in-person therapy.   Patient Details  Name: Judy Williamson MRN: [id number] Date of Birth: Sep 22, 2000 No data recorded  Encounter Date: 07/30/2019  PT End of Session - 07/31/19 1114    Visit Number  10    Number of Visits  10    Date for PT Re-Evaluation  08/06/19    Authorization Type  BCBS    Authorization - Visit Number  10    Authorization - Number of Visits  10    Progress Note Due on Visit  10    PT Start Time  1630    PT Stop Time  1730    PT Time Calculation (min)  60 min    Activity Tolerance  Patient tolerated treatment well;No increased pain    Behavior During Therapy  WFL for tasks assessed/performed       Past Medical History:  Diagnosis Date  . Anxiety   . IBS (irritable bowel syndrome)     Past Surgical History:  Procedure Laterality Date  . BREAST REDUCTION SURGERY Bilateral 09/2018  . HYMENECTOMY  11/2016    There were no vitals filed for this visit.    Pelvic Floor Physical Therapy Treatment Note  SCREENING  Changes in medications, allergies, or medical history?:   SUBJECTIVE  Patient reports: Tried the self-release tool for 1 min On Monday and gave up, is afraid of it. Used orange dilator on Tuesday, orange and Purple dilators yesterday and was not able to use her valium today before her visit.   Precautions:  Anxiety, prior hymenectomy, LLD,    Sexual activity/pain: H/o pain with both initial penetration and deeper  thrusting.  Location of pain: , hip-flexors (lower abdomen) Current pain: 0/10 (0/10) Max pain: 0/10 (4/10) Least pain: 0/10 (0/10) Nature of pain:achy, shooting, stabbing  Patient Goals: Wants to be able to have sex without pain and get more comfortable with her body. alleviate stomach issues.   OBJECTIVE  Changes in: Posture/Observations:  Hyperlordosis, FHP with hidden C5-C6  Range of Motion/Flexibilty:  Decreased ability to achieve upright posture through thoracic spine due to [facility] and anterior chest tightness.   Strength/MMT:  LE MMT:   Pelvic Floor (from 3/17-21) [External Exam: Introitus Appears: elevated Skin integrity: WNL  Palpation: not assessed Cough: not assessed Prolapse visible?: no Scar mobility: N/A  Internal Vaginal Exam: Strength (PERF): WNL Symmetry: symmetrical Palpation: TTP throughout Prolapse: No]  Today: TTP through all PFM on the L with greatest TTP at coccygeus and posterior PR/PC/IC.   Abdominal:   Palpation: TTP to B pectineus and adductor brevis with referred urge to have a BM sensation into the rectum.(from prior session)  Gait Analysis:  INTERVENTIONS THIS SESSION: Manual: Performed STM and TP release to all PFM on the L with at least 50% reduction at coccygeus and nearly full relaxation in all other areas. to decrease pain and spasm and allow for improved scapular retraction ROM and head position to offload cervical musculature and decompress nerve roots.    Total time: 60 min.  PT Short Term Goals - 05/28/19 1328      PT SHORT TERM GOAL #1   Title  Patient will demonstrate a coordinated contraction, relaxation, and bulge of the pelvic floor muscles to demonstrate functional recruitment and motion and allow for further strengthening.    Baseline  Pt. Hx. of severe constipation indicating likely poor coordination/relaxation    Time  5    Period  Weeks    Status  New     Target Date  07/02/19      PT SHORT TERM GOAL #2   Title  Pt. will demonstrate decreased FHP and hyperkyphosis to allow for down-regulation of the nervous system and decreased pain    Baseline  Pt. responding very slowly to prior treatment, high sensitivity and anxiety.    Time  5    Period  Weeks    Status  New    Target Date  07/02/19      PT SHORT TERM GOAL #3   Title  Pt. will demonstrate reduced rib-flare and activation of deep-core musculature with functional activity and exercise to allow for further symptom reduction.    Baseline  Decreased rib mobility, difficulty recruiting deep-core and rib flare/hyperlordosis.    Time  6    Period  Weeks    Status  New    Target Date  07/10/18      PT SHORT TERM GOAL #4   Title  Patient will demonstrate improved pelvic alignment and balance of musculature surrounding the pelvis to facilitate decreased PFM spasms and decrease pelvic pain.        PT Long Term Goals - 05/28/19 1317      PT LONG TERM GOAL #1   Title  Patient will report no pain with intercourse to demonstrate improved functional ability.    Baseline  Pain both with initial penetration and with deeper thrusting    Time  10    Period  Weeks    Status  New    Target Date  08/06/19      PT LONG TERM GOAL #2   Title  Patient will report having BM's at least every-other day with consistency between [facility] stool scale 3-5 over the prior week without use of medication to regulate to demonstrate decreased constipation.    Baseline  Pt. has IBS and recently had severe constipation, is now having liquid BM passing around the hardened stool.    Time  10    Period  Weeks    Status  New    Target Date  08/06/19      PT LONG TERM GOAL #3   Title  Pt. will improve in FOTO score by 10 points to demonstrate improved function.    Baseline  FOTO PFDI pain: 21%    Time  10    Period  Weeks    Status  New    Target Date  08/06/19      PT LONG TERM GOAL #4   Title  Patient  will demonstrate improved sitting and standing posture to demonstrate learning and decrease stress on the pelvic floor with functional activity.    Baseline  hyperkyphosis, FHP, very mild L thoracic scoliosis (possibly 2/2 LLD)    Time  10    Period  Weeks    Status  New    Target Date  08/06/19      PT LONG TERM GOAL #5   Title  Pt. will demonstrate ability to maintain decreased Sx across  2 weeks using stress management techniques, diet, and HEP independently to demonstrate improved function and condition management strategies.    Baseline  Pt. seeks care when in school and at home but continues to have interruptions to her care and is not yet IND with symptom management strategies.    Time  10    Period  Weeks    Status  New    Target Date  08/06/19            Plan - 07/31/19 1116    Clinical Impression Statement  Pt. Responded well to all interventions today, demonstrating improved ability to tolerate PFM release without use of valium an spasm reduction through the entire L side in one session, as well as understanding and correct performance of all education and exercises provided today. They will continue to benefit from skilled physical therapy to work toward remaining goals and maximize function as well as decrease likelihood of symptom increase or recurrence.     PT Next Visit Plan  Internal TP release to R! re-assess goals and Re-cert. TP release to L>R anterior shoulder/pecs, Further internal TP release to R>L, anterior>posterior, re-assess pectineus and add brevis, re-assess T-L junction and  traction PRN and review chin-tucks and scap retractions. Give progressive relaxation techniques, cervical traction.    PT Home Exercise Plan  colonic massage, self-spinting/disimpact, chin-tucks, scapular retractions, hip-flexor stretch, butterfly stretch, dilator frequency, seated posture, self TP release,    Consulted and Agree with Plan of Care  Patient       Patient will benefit  from skilled therapeutic intervention in order to improve the following deficits and impairments:     Visit Diagnosis: Other muscle spasm  Leg length discrepancy  Abnormal posture  Other idiopathic scoliosis, thoracic region     Problem List Patient Active Problem List   Diagnosis Date Noted  . Anxiety 02/21/2017   Cleophus Molt DPT, ATC Cleophus Molt 07/31/2019, 11:17 AM  [place] [facility] MAIN [facility] SERVICES [address] [place], [place], [address] Phone: [phone]   Fax:  [phone]  Name: Judy Williamson MRN: [id number] Date of Birth: 26-Feb-2001

## 2019-08-06 ENCOUNTER — Ambulatory Visit: Payer: BC Managed Care – PPO

## 2019-08-13 ENCOUNTER — Ambulatory Visit: Payer: BC Managed Care – PPO

## 2019-08-20 ENCOUNTER — Ambulatory Visit: Payer: BC Managed Care – PPO | Attending: Obstetrics & Gynecology

## 2019-08-20 ENCOUNTER — Other Ambulatory Visit: Payer: Self-pay

## 2019-08-20 DIAGNOSIS — M62838 Other muscle spasm: Secondary | ICD-10-CM | POA: Diagnosis present

## 2019-08-20 DIAGNOSIS — M4124 Other idiopathic scoliosis, thoracic region: Secondary | ICD-10-CM | POA: Insufficient documentation

## 2019-08-20 DIAGNOSIS — R293 Abnormal posture: Secondary | ICD-10-CM | POA: Diagnosis present

## 2019-08-20 DIAGNOSIS — M2170 Unequal limb length (acquired), unspecified site: Secondary | ICD-10-CM | POA: Insufficient documentation

## 2019-08-20 NOTE — Therapy (Addendum)
[place] [facility] MAIN [facility] SERVICES [address] [place], [place], [address] Phone: [phone]   Fax:  [phone]  Physical Therapy Treatment  The patient has been informed of current processes in place at Outpatient Rehab to protect patients from Covid-19 exposure including social distancing, schedule modifications, and new cleaning procedures. After discussing their particular risk with a therapist based on the patient's personal risk factors, the patient has decided to proceed with in-person therapy.   Patient Details  Name: Judy Williamson MRN: [id number] Date of Birth: [birth year] No data recorded  Encounter Date: 08/20/2019  PT End of Session - 08/25/19 0822    Visit Number  11    Number of Visits  14    Date for PT Re-Evaluation  08/06/19    Authorization Type  BCBS    Authorization - Visit Number  1    Authorization - Number of Visits  5    Progress Note Due on Visit  10    PT Start Time  1636    PT Stop Time  1736    PT Time Calculation (min)  60 min    Activity Tolerance  Patient tolerated treatment well;No increased pain    Behavior During Therapy  WFL for tasks assessed/performed       Past Medical History:  Diagnosis Date  . Anxiety   . IBS (irritable bowel syndrome)     Past Surgical History:  Procedure Laterality Date  . BREAST REDUCTION SURGERY Bilateral 09/2018  . HYMENECTOMY  11/2016    There were no vitals filed for this visit.    Pelvic Floor Physical Therapy Treatment Note  SCREENING  Changes in medications, allergies, or medical history?:   SUBJECTIVE  Patient reports: She has been having more normal BM's since starting gut therapy. Had a day where she was having a lot of pain near her QL on both sides but she did some stretches and it helped.  Precautions:  Anxiety, prior hymenectomy, LLD,    Sexual activity/pain: H/o pain with both initial penetration and deeper thrusting.  Location of pain: ,  hip-flexors (lower abdomen) Current pain: 0/10 (0/10) Max pain: 4/10 (0/10) Least pain: 0/10 (0/10) Nature of pain:achy, shooting, stabbing  Patient Goals: Wants to be able to have sex without pain and get more comfortable with her body. alleviate stomach issues.   OBJECTIVE  Changes in: Posture/Observations:  Hyperlordosis  Range of Motion/Flexibilty:   Strength/MMT:  LE MMT:   Pelvic Floor (from 3/17-21) [External Exam: Introitus Appears: elevated Skin integrity: WNL  Palpation: not assessed Cough: not assessed Prolapse visible?: no Scar mobility: N/A  Internal Vaginal Exam: Strength (PERF): WNL Symmetry: symmetrical Palpation: TTP throughout Prolapse: No]  Abdominal:   Palpation: TTP to B multifidus and paraspinals through lumbar spine.  Gait Analysis:  INTERVENTIONS THIS SESSION: Manual: Performed STM and TP release to B paraspinals along lumbar spine to decrease pain and spasm and allow for improved scapular retraction ROM and head position to offload cervical musculature and decompress nerve roots.   Dry-needle: Performed TPDN with 4 .30x56mm needles and standard approach as described below to decrease spasm and pain and allow for improved balance of musculature for improved function and decreased symptoms.  Self-care: re-assessed goals and determined POC and prioritization leading up to the summer break.  E-stim: Performed E-stim with TPDN from ~ T12 to L 3 and again from ~ T3 to L5 with a unilateral setup into the multifidus and a frequency of 2 and  muscle twitch intensity.   Total time: 60 min.                    Trigger Point Dry Needling - 08/25/19 0001    Consent Given?  Yes    Education Handout Provided  No    Muscles Treated Back/Hip  Erector spinae;Lumbar multifidi;Thoracic multifidi    Electrical Stimulation Performed with Dry Needling  Yes    E-stim with Dry Needling Details  Frequency of 2 milliamps, slight  contraction intensity, 8 min.     Erector spinae Response  Twitch response elicited;Palpable increased muscle length    Lumbar multifidi Response  Twitch response elicited;Palpable increased muscle length    Quadratus Lumborum Response  Twitch response elicited;Palpable increased muscle length             PT Short Term Goals - 08/20/19 1640      PT SHORT TERM GOAL #1   Title  Patient will demonstrate a coordinated contraction, relaxation, and bulge of the pelvic floor muscles to demonstrate functional recruitment and motion and allow for further strengthening.    Baseline  Pt. Hx. of severe constipation indicating likely poor coordination/relaxation    Time  5    Period  Weeks    Status  Achieved    Target Date  07/02/19      PT SHORT TERM GOAL #2   Title  Pt. will demonstrate decreased FHP and hyperkyphosis to allow for down-regulation of the nervous system and decreased pain    Baseline  Pt. responding very slowly to prior treatment, high sensitivity and anxiety. As of 5/5: improved by ~ 50%    Time  5    Period  Weeks    Status  Achieved    Target Date  07/02/19      PT SHORT TERM GOAL #3   Title  Pt. will demonstrate reduced rib-flare and activation of deep-core musculature with functional activity and exercise to allow for further symptom reduction.    Baseline  Decreased rib mobility, difficulty recruiting deep-core and rib flare/hyperlordosis.    Time  6    Period  Weeks    Status  On-going    Target Date  09/24/19      PT SHORT TERM GOAL #4   Title  Patient will demonstrate improved pelvic alignment and balance of musculature surrounding the pelvis to facilitate decreased PFM spasms and decrease pelvic pain.    Baseline  L up-slip    Time  6    Period  Weeks    Status  Achieved    Target Date  07/10/19        PT Long Term Goals - 08/20/19 1708      PT LONG TERM GOAL #1   Title  Patient will report no pain with intercourse to demonstrate improved functional  ability.    Baseline  Pain both with initial penetration and with deeper thrusting    Time  10    Period  Weeks    Status  On-going    Target Date  09/24/19      PT LONG TERM GOAL #2   Title  Patient will report having BM's at least every-other day with consistency between [facility] stool scale 3-5 over the prior week without use of medication to regulate to demonstrate decreased constipation.    Baseline  Pt. has IBS and recently had severe constipation, is now having liquid BM passing around the hardened stool. As of 5/5:  just taking mirilax once every 1-2 weeks    Time  10    Period  Weeks    Status  Achieved    Target Date  08/06/19      PT LONG TERM GOAL #3   Title  Pt. will improve in FOTO score by 10 points to demonstrate improved function.    Baseline  FOTO PFDI pain: 21% As of 5/5: 25%    Time  10    Period  Weeks    Status  On-going    Target Date  09/24/19      PT LONG TERM GOAL #4   Title  Patient will demonstrate improved sitting and standing posture to demonstrate learning and decrease stress on the pelvic floor with functional activity.    Baseline  hyperkyphosis, FHP, very mild L thoracic scoliosis (possibly 2/2 LLD) As of 5/5: Pt demonstrates ~ 30% improved posture with standing and walking, ~ 50% improved in sitting. lack of compliance with strengthening has prevented further improvement thus far.    Time  10    Period  Weeks    Status  On-going    Target Date  08/06/19      PT LONG TERM GOAL #5   Title  Pt. will demonstrate ability to maintain decreased Sx across 2 weeks using stress management techniques, diet, and HEP independently to demonstrate improved function and condition management strategies.    Baseline  Pt. seeks care when in school and at home but continues to have interruptions to her care and is not yet IND with symptom management strategies.    Time  10    Period  Weeks    Status  On-going    Target Date  09/24/19            Plan -  08/25/19 7622    Clinical Impression Statement  Pt. continues to make slow progress due to interruptions in therapy between school/class schedule and traveling out of town when on breaks from school as well as hit-or-miss- compliance with HEP. She continues to demonstrate high PFM tone and some fear/avoidance behavior regarding her self internal release and dilation. Pt. has implemented many of the behavioral modifications suggested, which has helped maintain decreased pain and she is seeing a counselor who she has given permission to communicate directly with PT on her behalf but she will continue to benefit from skilled PT to increase emphasis on strengthening and help Pt. become more independent with her exercises so she can continue to see improvement over the summer. We will continue for up to 5 more visits at 1x/week until she leaves [place] for the summer.    Personal Factors and Comorbidities  Comorbidity 2    Comorbidities  High anxiety and IBS    Examination-Activity Limitations  Toileting    Examination-Participation Restrictions  Interpersonal Relationship;School;Driving    Stability/Clinical Decision Making  Evolving/Moderate complexity    Clinical Decision Making  Moderate    Rehab Potential  Good    Clinical Impairments Affecting Rehab Potential  IBS, Anxiety    PT Frequency  1x / week    PT Duration  Other (comment)    PT Treatment/Interventions  ADLs/Self Care Home Management;Biofeedback;Moist Heat;Electrical Stimulation;Ultrasound;Therapeutic activities;Functional mobility training;Gait training;Therapeutic exercise;Neuromuscular re-education;Patient/family education;Manual techniques;Scar mobilization;Taping;Dry needling;Passive range of motion;Spinal Manipulations;Joint Manipulations    PT Next Visit Plan  Internal TP release to R! re-assess goals and Re-cert. TP release to L>R anterior shoulder/pecs, Further internal TP release to R>L, anterior>posterior, re-assess pectineus  and add  brevis, re-assess T-L junction and  traction PRN and review chin-tucks and scap retractions. Give progressive relaxation techniques, cervical traction.    PT Home Exercise Plan  colonic massage, self-spinting/disimpact, chin-tucks, scapular retractions, hip-flexor stretch, butterfly stretch, dilator frequency, seated posture, self TP release,    Consulted and Agree with Plan of Care  Patient       Patient will benefit from skilled therapeutic intervention in order to improve the following deficits and impairments:  Decreased mobility, Increased muscle spasms, Decreased range of motion, Decreased scar mobility, Impaired tone, Improper body mechanics, Decreased activity tolerance, Decreased coordination, Increased fascial restricitons, Impaired flexibility, Postural dysfunction, Pain  Visit Diagnosis: Other muscle spasm - Plan: PT plan of care cert/re-cert  Leg length discrepancy - Plan: PT plan of care cert/re-cert  Abnormal posture - Plan: PT plan of care cert/re-cert  Other idiopathic scoliosis, thoracic region - Plan: PT plan of care cert/re-cert     Problem List Patient Active Problem List   Diagnosis Date Noted  . Anxiety 02/21/2017   Cleophus Molt DPT, ATC Cleophus Molt 08/25/2019, 8:43 AM  [place] [facility] MAIN [facility] SERVICES [address] [place], [place], [address] Phone: [phone]   Fax:  [phone]  Name: Tailynn Armetta MRN: [id number] Date of Birth: 03-12-2001

## 2019-08-25 NOTE — Addendum Note (Signed)
Addended by: Flora Lipps T on: 08/25/2019 08:41 AM   Modules accepted: Orders

## 2019-08-27 ENCOUNTER — Ambulatory Visit: Payer: BC Managed Care – PPO

## 2019-08-27 ENCOUNTER — Other Ambulatory Visit: Payer: Self-pay

## 2019-08-27 DIAGNOSIS — M4124 Other idiopathic scoliosis, thoracic region: Secondary | ICD-10-CM

## 2019-08-27 DIAGNOSIS — M2170 Unequal limb length (acquired), unspecified site: Secondary | ICD-10-CM

## 2019-08-27 DIAGNOSIS — M62838 Other muscle spasm: Secondary | ICD-10-CM | POA: Diagnosis not present

## 2019-08-27 DIAGNOSIS — R293 Abnormal posture: Secondary | ICD-10-CM

## 2019-08-27 NOTE — Patient Instructions (Addendum)
  Pull your shoulders back and down, gently support your legs at the knee, make sure your lower tummy muscles are pulled in. Hold until your form feels like it is about to fail. (you were at ~ 5 seconds today) build your endurance up to ~ 30 seconds then increase the difficulty by letting go with the hands, deepening the V at your hips, straightening your legs, etc.   Exhale and pull the shoulder blades back then keep going with your arms until you feel a tight squeeze between your shoulders, slowly release as you inhale. Do 2x15. Make sure to keep the tummy engaged as you pull back.   Keep the torso straight as you inhale hinging at the hip and bringing your bottom back and then exhale, drawing in the lower abdomen and squeezing through the glutes to come back to tall kneeling.  Do this _15x2__ times, _1-2__ times per day.  colonic massage, chin-tucks, scapular retractions, hip-flexor stretch, butterfly stretch.

## 2019-08-27 NOTE — Therapy (Signed)
[facility] MAIN [facility] SERVICES [address] [place], [place], [address] Phone: [phone]   Fax:  [phone]  Physical Therapy Treatment and Discharge Summary  The patient has been informed of current processes in place at Outpatient Rehab to protect patients from Covid-19 exposure including social distancing, schedule modifications, and new cleaning procedures. After discussing their particular risk with a therapist based on the patient's personal risk factors, the patient has decided to proceed with in-person therapy.  Patient Details  Name: Judy Williamson MRN: [id number] Date of Birth: 2001-03-14 No data recorded  Encounter Date: 08/27/2019  PT End of Session - 08/27/19 1758    Visit Number  12    Number of Visits  14    Date for PT Re-Evaluation  08/06/19    Authorization Type  [employer]    Authorization - Visit Number  2    Authorization - Number of Visits  5    Progress Note Due on Visit  10    PT Start Time  1535    PT Stop Time  1635    PT Time Calculation (min)  60 min    Activity Tolerance  Patient tolerated treatment well;No increased pain    Behavior During Therapy  WFL for tasks assessed/performed       Past Medical History:  Diagnosis Date  . Anxiety   . IBS (irritable bowel syndrome)     Past Surgical History:  Procedure Laterality Date  . BREAST REDUCTION SURGERY Bilateral 09/2018  . HYMENECTOMY  11/2016    There were no vitals filed for this visit.   Pelvic Floor Physical Therapy Treatment Note  SCREENING  Changes in medications, allergies, or medical history?: no  SUBJECTIVE  Patient reports: Doing pretty well, has not dilated. Used suppository before today's session but it fell out before fully dissolving. Her stress levels are a little lower though her brothers are in [place] in a bomb shelter right now.    Precautions:  Anxiety, prior hymenectomy, LLD,    Sexual activity/pain: H/o pain with both  initial penetration and deeper thrusting.  Location of pain: , hip-flexors (lower abdomen) Current pain: 0/10 (0/10) Max pain: 0/10 (0/10) Least pain: 0/10 (0/10) Nature of pain:tight  Patient Goals: Wants to be able to have sex without pain and get more comfortable with her body. alleviate stomach issues.   OBJECTIVE  Changes in: Posture/Observations:  Hyperlordosis  Range of Motion/Flexibilty:   Strength/MMT:  LE MMT:   Pelvic Floor  External Exam: Introitus Appears: elevated Skin integrity: WNL  Palpation: not assessed Cough: not assessed Prolapse visible?: no Scar mobility: N/A  Internal Vaginal Exam: Strength (PERF): Pt. Had difficulty recruiting PFM, required MOD verbal cues to achieve  3+/5 squeeze for "anti-kegel" Symmetry: symmetrical Palpation: burning at posterior fourchette, not re-assessed elsewhere. Prolapse: No  Abdominal:  Pt. Continues to require cueing to engage deep-core and for timing of exhale with exercise. Pt. Easily distracted and reverts to improper timing/coordination. Able to do 3 in a row correctly and describes understanding at EOS.  Palpation:  Gait Analysis:  INTERVENTIONS THIS SESSION: Manual: Performed scar lengthening/TP release to posterior fourchette to decrease tension on nerve bundle and allow for decreased sensitivity and spasm of PFM to decrease pain with vaginal penetration.   Therex: Educated on and practiced "anti-kegels" to help improve relaxation/lengthening of the PFM as well as seated rows, hip-hinges, and modified boat-pose to improve strength of muscles opposing tight musculature to allow reciprocal inhibition to improve  balance of musculature surrounding the pelvis and improve overall posture for optimal musculature length-tension relationship and function.  Total time: 60 min.                             PT Short Term Goals - 08/20/19 1640      PT SHORT TERM GOAL #1   Title   Patient will demonstrate a coordinated contraction, relaxation, and bulge of the pelvic floor muscles to demonstrate functional recruitment and motion and allow for further strengthening.    Baseline  Pt. Hx. of severe constipation indicating likely poor coordination/relaxation    Time  5    Period  Weeks    Status  Achieved    Target Date  07/02/19      PT SHORT TERM GOAL #2   Title  Pt. will demonstrate decreased FHP and hyperkyphosis to allow for down-regulation of the nervous system and decreased pain    Baseline  Pt. responding very slowly to prior treatment, high sensitivity and anxiety. As of 5/5: improved by ~ 50%    Time  5    Period  Weeks    Status  Achieved    Target Date  07/02/19      PT SHORT TERM GOAL #3   Title  Pt. will demonstrate reduced rib-flare and activation of deep-core musculature with functional activity and exercise to allow for further symptom reduction.    Baseline  Decreased rib mobility, difficulty recruiting deep-core and rib flare/hyperlordosis.    Time  6    Period  Weeks    Status  On-going    Target Date  09/24/19      PT SHORT TERM GOAL #4   Title  Patient will demonstrate improved pelvic alignment and balance of musculature surrounding the pelvis to facilitate decreased PFM spasms and decrease pelvic pain.    Baseline  L up-slip    Time  6    Period  Weeks    Status  Achieved    Target Date  07/10/19        PT Long Term Goals - 08/20/19 1708      PT LONG TERM GOAL #1   Title  Patient will report no pain with intercourse to demonstrate improved functional ability.    Baseline  Pain both with initial penetration and with deeper thrusting    Time  10    Period  Weeks    Status  On-going    Target Date  09/24/19      PT LONG TERM GOAL #2   Title  Patient will report having BM's at least every-other day with consistency between [facility] stool scale 3-5 over the prior week without use of medication to regulate to demonstrate decreased  constipation.    Baseline  Pt. has IBS and recently had severe constipation, is now having liquid BM passing around the hardened stool. As of 5/5: just taking mirilax once every 1-2 weeks    Time  10    Period  Weeks    Status  Achieved    Target Date  08/06/19      PT LONG TERM GOAL #3   Title  Pt. will improve in FOTO score by 10 points to demonstrate improved function.    Baseline  FOTO PFDI pain: 21% As of 5/5: 25%    Time  10    Period  Weeks    Status  On-going  Target Date  09/24/19      PT LONG TERM GOAL #4   Title  Patient will demonstrate improved sitting and standing posture to demonstrate learning and decrease stress on the pelvic floor with functional activity.    Baseline  hyperkyphosis, FHP, very mild L thoracic scoliosis (possibly 2/2 LLD) As of 5/5: Pt demonstrates ~ 30% improved posture with standing and walking, ~ 50% improved in sitting. lack of compliance with strengthening has prevented further improvement thus far.    Time  10    Period  Weeks    Status  On-going    Target Date  08/06/19      PT LONG TERM GOAL #5   Title  Pt. will demonstrate ability to maintain decreased Sx across 2 weeks using stress management techniques, diet, and HEP independently to demonstrate improved function and condition management strategies.    Baseline  Pt. seeks care when in school and at home but continues to have interruptions to her care and is not yet IND with symptom management strategies.    Time  10    Period  Weeks    Status  On-going    Target Date  09/24/19            Plan - 08/27/19 1748    Clinical Impression Statement  Zionna states that she is having musch less pain and improved GI regularity since beginning PT this time around though she still has pain with vaginal penetration. She acknowledges that this would be improved as well if she were more diligent with her HEP, says she will have more time this summer to focus on it. Pt. responded well to all  interventions today demonstrating appropriate understanding of and performance of all education and exercises given today. She demonstrates burning with pressure at the posterior fourchette which is likely due to scar tissue from hymnectomy and did elongate during treatment session which should help decrease discomfort with and improve compliance with dilation and self TP release internally. She states that she knows that she needs to work on the strengthening exercises given to continue to see progress over the summer and states that she plans on returning to the PT she was seeing the last time she went home for the summer as well as continuing sexual counseling and gut therapy. She will continue to benefit from skilled pelvic health PT at this time but we will D/C to her HEP today due to her leaving for the summer.    Personal Factors and Comorbidities  Comorbidity 2    Comorbidities  High anxiety and IBS    Examination-Activity Limitations  Toileting    Examination-Participation Restrictions  Interpersonal Relationship;School;Driving    Stability/Clinical Decision Making  Evolving/Moderate complexity    Rehab Potential  Good    Clinical Impairments Affecting Rehab Potential  IBS, Anxiety    PT Frequency  1x / week    PT Duration  Other (comment)    PT Treatment/Interventions  ADLs/Self Care Home Management;Biofeedback;Moist Heat;Electrical Stimulation;Ultrasound;Therapeutic activities;Functional mobility training;Gait training;Therapeutic exercise;Neuromuscular re-education;Patient/family education;Manual techniques;Scar mobilization;Taping;Dry needling;Passive range of motion;Spinal Manipulations;Joint Manipulations    PT Next Visit Plan  D/C    PT Home Exercise Plan  colonic massage, self-spinting/disimpact, chin-tucks, scapular retractions, hip-flexor stretch, butterfly stretch, dilator frequency, seated posture, self TP release, Seated rows, hip-hinges, modified boat pose, anti-kegels.     Consulted and Agree with Plan of Care  Patient       Patient will benefit from skilled therapeutic intervention in order  to improve the following deficits and impairments:  Decreased mobility, Increased muscle spasms, Decreased range of motion, Decreased scar mobility, Impaired tone, Improper body mechanics, Decreased activity tolerance, Decreased coordination, Increased fascial restricitons, Impaired flexibility, Postural dysfunction, Pain  Visit Diagnosis: Other muscle spasm  Leg length discrepancy  Abnormal posture  Other idiopathic scoliosis, thoracic region     Problem List Patient Active Problem List   Diagnosis Date Noted  . Anxiety 02/21/2017   Cleophus Molt DPT, ATC Cleophus Molt 08/27/2019, 5:59 PM  [facility] MAIN [facility] SERVICES [address] [place], [place], [address] Phone: [phone]   Fax:  [phone]  Name: Renalda Locklin MRN: [id number] Date of Birth: [birth year]

## 2020-03-25 ENCOUNTER — Other Ambulatory Visit: Payer: Self-pay

## 2020-03-25 ENCOUNTER — Ambulatory Visit: Payer: 59 | Attending: Obstetrics & Gynecology

## 2020-03-25 DIAGNOSIS — M2170 Unequal limb length (acquired), unspecified site: Secondary | ICD-10-CM | POA: Insufficient documentation

## 2020-03-25 DIAGNOSIS — R293 Abnormal posture: Secondary | ICD-10-CM | POA: Diagnosis present

## 2020-03-25 DIAGNOSIS — M62838 Other muscle spasm: Secondary | ICD-10-CM | POA: Diagnosis present

## 2020-03-25 DIAGNOSIS — M4124 Other idiopathic scoliosis, thoracic region: Secondary | ICD-10-CM | POA: Insufficient documentation

## 2020-03-25 NOTE — Therapy (Signed)
[facility] MAIN [facility] SERVICES [address] [place], [place], [address] Phone: [phone]   Fax:  [phone]  Physical Therapy Evaluation  The patient has been informed of current processes in place at Outpatient Rehab to protect patients from Covid-19 exposure including social distancing, schedule modifications, and new cleaning procedures. After discussing their particular risk with a therapist based on the patient's personal risk factors, the patient has decided to proceed with in-person therapy.  Patient Details  Name: Judy Williamson MRN: [id number] Date of Birth: [birth year] No data recorded  Encounter Date: 03/25/2020   PT End of Session - 03/25/20 1339    Visit Number 1    Number of Visits 12    Date for PT Re-Evaluation 06/17/20    Authorization Type [employer]    Authorization Time Period 03/25/20 through 06/17/20    Authorization - Visit Number 1    Authorization - Number of Visits 12    PT Start Time 1245    PT Stop Time 1345    PT Time Calculation (min) 60 min    Activity Tolerance Patient tolerated treatment well;No increased pain    Behavior During Therapy WFL for tasks assessed/performed           Past Medical History:  Diagnosis Date  . Anxiety   . IBS (irritable bowel syndrome)     Past Surgical History:  Procedure Laterality Date  . BREAST REDUCTION SURGERY Bilateral 09/2018  . HYMENECTOMY  11/2016    There were no vitals filed for this visit.  Pelvic Floor Physical Therapy Evaluation and Assessment  SCREENING  Falls in last 6 mo: no   Patient's communication preference: email  Red Flags:  Have you had any night sweats? no Unexplained weight loss? no Saddle anesthesia? no Unexplained changes in bowel or bladder habits? none  SUBJECTIVE  Patient reports: She has been having a lot of LBP for ~ 3 weeks and has had some cramping and achiness in BLE. She has been doing a lot of stretches from the [employer]. Has done some of the proposal pose and others from PT> Has dilated some because she was interested in trying to have intercourse but has not yet. Was able to have a pelvic exam and ultrasound when she was able to insert the device herself. It hurt but was not "excruciating". Went to an allergist and found out that she has a lot of food allergies and it has helped her GI Sx. A lot. Feels like most of it comes out but then she has a sensation of incomplete emptying and "pushes" to get the rest out. Not having urinary frequency. Has been doing a lot of meditation and journaling. Has had a lot of anxiety but feels like she is handling it alright. Able to wear a tampon now but it is still very uncomfortable.   Precautions:  Anxiety, prior hymenectomy, LLD  Social/Family/Vocational History:   Full time student  Recent Procedures/Tests/Findings:  Ultrasound and pelvic exam normal  Obstetrical History: none  Gynecological History: hymnectomy  Urinary History: H/o frequncy, doing well recently  Gastrointestinal History: Having ~ 3 BM's per day. Type ~ 5 bristol stool scale.  Sexual activity/pain: H/o significant pain   Location of pain: LB  Current pain:  5/10  Max pain:  10/10 Least pain:  3/10 Nature of pain: Achy  Patient Goals: To work on more internal release so she can comfortably have intercourse and reduce her back, pelvic, and LE pain.  OBJECTIVE  Posture/Observations:  Sitting:  Standing: R PSIS and ASIS high in standing, L ASIS low in supine  Palpation/Segmental Motion/Joint Play: WNL through lower thoracic and sacrum for mobility, decreased mobility through upper thoracic spine with pain with PIVM. Spasms through B hip-flexors and lumbar extensors, L>R piriformis and glute Med.  Special tests:   Supine-to-long-sit: RLE long in supine, longer in sitting. (L anterior rotation)  Range of Motion/Flexibilty:  Spine: Fingers ~ 6 in. From floor with forward bend,  little lumbar flexion ROM. Hips:   Strength/MMT: deferred to follow-up LE MMT  LE MMT Left Right  Hip flex:  (L2) /5 /5  Hip ext: /5 /5  Hip abd: /5 /5  Hip add: /5 /5  Hip IR /5 /5  Hip ER /5 /5     Abdominal:  Palpation: TTP to B rectus and diaphragm, rib-flare Diastasis: none  Pelvic Floor External Exam: Deferred to follow-up Introitus Appears:  Skin integrity:  Palpation: Cough: Prolapse visible?: Scar mobility:  Internal Vaginal Exam: Strength (PERF):  Symmetry: Palpation: Prolapse:   Internal Rectal Exam: Strength (PERF): Symmetry: Palpation: Prolapse:   Gait Analysis: deferred to follow-up   Pelvic Floor Outcome Measures: FOTO   INTERVENTIONS THIS SESSION: Manual: Performed TP release and STM to B lumbar multifidus at ~ L1 and L4 and R QL to decrease spasm and pain and allow for improved balance of musculature for improved function and decreased symptoms.  Dry-needle: Performed TPDN with a .30x3575mm needle and standard approach as described below to decrease spasm and pain and allow for improved balance of musculature for improved function and decreased symptoms.  Therex: Reviewed and practiced mini-marches and added band to help improve obliques recruitment and decrease rib-flare and hyperlordosis.  Total time: 70 min.                   Objective measurements completed on examination: See above findings.                 PT Short Term Goals - 03/25/20 1408      PT SHORT TERM GOAL #1   Title Patient will demonstrate improved pelvic alignment and balance of musculature surrounding the pelvis to facilitate decreased PFM spasms and decrease pelvic pain.    Baseline R up-slip and L anterior rotation. spasms surrounding B hips.    Time 6    Period Weeks    Status New    Target Date 05/06/20      PT SHORT TERM GOAL #2   Title Pt. will demonstrate deep-core coordination and timing with functional motions including push,  pull, squat, lunge and lifting motions to prevent recurrence of PFM spasms.    Baseline pt. has great difficulty coordinating her deep-core for mini-march exercise previously learned due to muscular imbalance surrounding pelvis.    Time 6    Period Weeks    Status New    Target Date 05/06/20      PT SHORT TERM GOAL #3   Title Patient will demonstrate full HEP x1 IND in the clinic with appropriate performance to demonstrate understanding and proper form to allow for further improvement.    Baseline Pt. has history of poor follow-through with HEP leading to Sx. recurrence.    Time 6    Period Weeks    Status New    Target Date 05/06/20             PT Long Term Goals - 03/25/20 1414  PT LONG TERM GOAL #1   Title Patient will report no pain with intercourse to demonstrate improved functional ability.    Baseline Pain both with initial penetration and with deeper thrusting    Time 12    Period Weeks    Status New    Target Date 06/17/20      PT LONG TERM GOAL #2   Title Patient will describe pain no greater than 2/10 during 30-45 min of moderate-intensity exercise to demonstrate improved functional ability.    Baseline Having pain as high as 10/10 when bending forward over the last 3 weeks, occasiaonal "cramping" pain in the pelvis and ache in BLE for ~ 3 months.    Time 12    Period Weeks    Status New    Target Date 06/17/20      PT LONG TERM GOAL #3   Title Pt. will improve in FOTO score by 10 points to demonstrate improved function.    Baseline FOTO PFDI pain: 21% As of 5/5: 25%, Emailed FOTO survey 12/9.    Time 12    Period Weeks    Status New    Target Date 06/17/20      PT LONG TERM GOAL #4   Title Pt. will demonstrate ability to maintain decreased Sx across 2 weeks using stress management techniques, diet, and HEP independently to demonstrate improved function and condition management strategies.    Baseline Pt. seeks care when in school and at home but  continues to have interruptions to her care and is not yet IND with symptom management strategies.    Time 12    Period Weeks    Status New    Target Date 06/17/20                  Plan - 03/25/20 1340    Clinical Impression Statement Pt. is a 19 y/o female who presents today with cheif c/o LBP and dyspareunia. Her PMH is significant for a anxiety, IBD, LLD, a hymnectomy, and 2 breast reductions. Her Clinical assessment revealed poor deep-core coordination and strength and spasms through B hip flexors and LB as well as L>R piriformis and Glute Med. she demonstrates a rib-flare and anterior pelvic tilt as well as a R up-slip and L anterior rotation. As this patient is returning, we know that she has baseline PFM spasms and tension. She has been working on her anxiety and GI issues but further intervention and integration of polyvagal theory will be beneficial to long-term improvement. Her glutes are underactive leading to over-activation of deep-hip stabilizers. She will benefit from skilled pelvic PT to address the noted deficits and to assess for and address any other potential causes of Sx.    Personal Factors and Comorbidities Comorbidity 2    Comorbidities Anxiety, IBD    Examination-Participation Restrictions Interpersonal Relationship;Other   Exercise   Stability/Clinical Decision Making Evolving/Moderate complexity    Clinical Decision Making Moderate    Rehab Potential Good    PT Frequency 1x / week    PT Duration 12 weeks    PT Treatment/Interventions ADLs/Self Care Home Management;Biofeedback;Electrical Stimulation;Moist Heat;Traction;Ultrasound;Functional mobility training;Gait training;Therapeutic activities;Therapeutic exercise;Balance training;Neuromuscular re-education;Orthotic Fit/Training;Patient/family education;Passive range of motion;Dry needling;Spinal Manipulations;Joint Manipulations    PT Next Visit Plan TPDN to B hip-flexors, review deep-core coordination and  hip-flexor stretches.    PT Home Exercise Plan Mini-marches with green band for oblique recruitment, cat-cow, side-stretch    Consulted and Agree with Plan of Care Patient  Patient will benefit from skilled therapeutic intervention in order to improve the following deficits and impairments:  Increased muscle spasms,Improper body mechanics,Decreased activity tolerance,Decreased coordination,Decreased strength,Increased fascial restricitons,Impaired flexibility,Pain,Postural dysfunction  Visit Diagnosis: Other muscle spasm  Leg length discrepancy  Abnormal posture  Other idiopathic scoliosis, thoracic region     Problem List Patient Active Problem List   Diagnosis Date Noted  . Anxiety 02/21/2017   Cleophus Molt DPT, ATC Cleophus Molt 03/25/2020, 2:19 PM  [place] [facility] MAIN [facility] SERVICES [address] [place], [place], [address] Phone: [phone]   Fax:  [phone]  Name: Judy Williamson MRN: [id number] Date of Birth: 08-27-2000

## 2020-03-25 NOTE — Patient Instructions (Signed)
Mini-Marches  Use your green band and press straight forward as you finish your exhale and flatten your back to engage the upper obliques.  Exhale, drawing the lower tummy (TA) in toward the back bone, followed by squeezing your glutes slightly then finally press up through the band and keep exhaling to engage the upper obliques, hold contraction while you lift one foot ~ 2 inches off the mat, then the other foot before relaxing and resetting. Try to keep your hips from rocking, using your hands to sense whether they are staying even as pictured.      Perform _15_ repetitions for _2__ sets. Do this _1-2_ times per day.  Cat / Cow Flow    Exhale, press spine toward ceiling like a Halloween cat. Keeping strength in arms and abdominals, Inhale to soften spine through neutral and into cow pose. Open chest and arch back. Initiate movement between cat and cow at tailbone, one vertebrae at a time. Repeat __30__ times.

## 2020-03-30 ENCOUNTER — Ambulatory Visit: Payer: 59

## 2020-03-30 ENCOUNTER — Other Ambulatory Visit: Payer: Self-pay

## 2020-03-30 DIAGNOSIS — R293 Abnormal posture: Secondary | ICD-10-CM

## 2020-03-30 DIAGNOSIS — M62838 Other muscle spasm: Secondary | ICD-10-CM

## 2020-03-30 DIAGNOSIS — M2170 Unequal limb length (acquired), unspecified site: Secondary | ICD-10-CM

## 2020-03-30 DIAGNOSIS — M4124 Other idiopathic scoliosis, thoracic region: Secondary | ICD-10-CM

## 2020-03-30 NOTE — Therapy (Signed)
[place] [facility] MAIN [facility] SERVICES [address] [place], [place], [address] Phone: [phone]   Fax:  [phone]  Physical Therapy Treatment  The patient has been informed of current processes in place at Outpatient Rehab to protect patients from Covid-19 exposure including social distancing, schedule modifications, and new cleaning procedures. After discussing their particular risk with a therapist based on the patient's personal risk factors, the patient has decided to proceed with in-person therapy.  Patient Details  Name: Judy Williamson MRN: [id number] Date of Birth: Jun 30, 2000 No data recorded  Encounter Date: 03/30/2020   PT End of Session - 03/30/20 1604    Visit Number 2    Number of Visits 12    Date for PT Re-Evaluation 06/17/20    Authorization Type [employer]    Authorization Time Period 03/25/20 through 06/17/20    Authorization - Visit Number 2    Authorization - Number of Visits 12    PT Start Time 1330    PT Stop Time 1430    PT Time Calculation (min) 60 min    Activity Tolerance Patient tolerated treatment well;No increased pain    Behavior During Therapy WFL for tasks assessed/performed           Past Medical History:  Diagnosis Date  . Anxiety   . IBS (irritable bowel syndrome)     Past Surgical History:  Procedure Laterality Date  . BREAST REDUCTION SURGERY Bilateral 09/2018  . HYMENECTOMY  11/2016    There were no vitals filed for this visit.     Pelvic Floor Physical Therapy Treatment Note  SCREENING  Changes in medications, allergies, or medical history?: none    SUBJECTIVE  Patient reports: She had sex on Sunday (far a few minutes) it hurt so she stopped. Did not "hurt" after but felt "stretched out" and "weird" after that day. Her back is feeling a lot better, still has a little pain still, exercises are helping.  Precautions:  Anxiety, prior hymenectomy, LLD  Pain update:  Location of pain:  LB Current pain:  2/10  Max pain:  5/10 Least pain:  1/10 Nature of pain: achy  ** Pt. Reports feels "like she worked out her abs" following treatment with no LBP.  Patient Goals: To work on more internal release so she can comfortably have intercourse and reduce her back, pelvic, and LE pain.    OBJECTIVE  Changes in: Posture/Observations:  PSIS appear level in standing and supine. Hyperlordotic, rib-flare.  Range of Motion/Flexibilty:    Strength/MMT:  LE MMT:  Pelvic floor:  Abdominal:   Palpation: TTP to B iliacus.  Gait Analysis:  INTERVENTIONS THIS SESSION: Manual: Performed TP release and STM to B Iliacus to decrease spasm and pain and allow for improved balance of musculature for improved function and decreased symptoms.  Dry-needle: Performed TPDN with a .30x186mm needle and standard approach as described below to decrease spasm and pain and allow for improved balance of musculature for improved function and decreased symptoms.  Therex: educated Pt. On performing hip-flexor, calf, hamstring, and LB stretches taught at prior visits following her walk and drinking extra water after her treatment to minimize soreness from TPDN.  Total time: 60 min.                     PT Short Term Goals - 03/25/20 1408      PT SHORT TERM GOAL #1   Title Patient will demonstrate improved pelvic alignment and balance  of musculature surrounding the pelvis to facilitate decreased PFM spasms and decrease pelvic pain.    Baseline R up-slip and L anterior rotation. spasms surrounding B hips.    Time 6    Period Weeks    Status New    Target Date 05/06/20      PT SHORT TERM GOAL #2   Title Pt. will demonstrate deep-core coordination and timing with functional motions including push, pull, squat, lunge and lifting motions to prevent recurrence of PFM spasms.    Baseline pt. has great difficulty coordinating her deep-core for mini-march exercise previously learned  due to muscular imbalance surrounding pelvis.    Time 6    Period Weeks    Status New    Target Date 05/06/20      PT SHORT TERM GOAL #3   Title Patient will demonstrate full HEP x1 IND in the clinic with appropriate performance to demonstrate understanding and proper form to allow for further improvement.    Baseline Pt. has history of poor follow-through with HEP leading to Sx. recurrence.    Time 6    Period Weeks    Status New    Target Date 05/06/20             PT Long Term Goals - 03/25/20 1414      PT LONG TERM GOAL #1   Title Patient will report no pain with intercourse to demonstrate improved functional ability.    Baseline Pain both with initial penetration and with deeper thrusting    Time 12    Period Weeks    Status New    Target Date 06/17/20      PT LONG TERM GOAL #2   Title Patient will describe pain no greater than 2/10 during 30-45 min of moderate-intensity exercise to demonstrate improved functional ability.    Baseline Having pain as high as 10/10 when bending forward over the last 3 weeks, occasiaonal "cramping" pain in the pelvis and ache in BLE for ~ 3 months.    Time 12    Period Weeks    Status New    Target Date 06/17/20      PT LONG TERM GOAL #3   Title Pt. will improve in FOTO score by 10 points to demonstrate improved function.    Baseline FOTO PFDI pain: 21% As of 5/5: 25%, Emailed FOTO survey 12/9.    Time 12    Period Weeks    Status New    Target Date 06/17/20      PT LONG TERM GOAL #4   Title Pt. will demonstrate ability to maintain decreased Sx across 2 weeks using stress management techniques, diet, and HEP independently to demonstrate improved function and condition management strategies.    Baseline Pt. seeks care when in school and at home but continues to have interruptions to her care and is not yet IND with symptom management strategies.    Time 12    Period Weeks    Status New    Target Date 06/17/20                  Plan - 03/30/20 1604    Clinical Impression Statement Pt. Responded well to all interventions today, demonstrating improved LBP and decreased spasm and increased deep-core recruitment/awareness, as well as understanding and correct performance of all education and exercises provided today. They will continue to benefit from skilled physical therapy to work toward remaining goals and maximize function as well as decrease  likelihood of symptom increase or recurrence.    PT Next Visit Plan review deep-core coordination and hip-flexor stretches, assess and release PFM.    PT Home Exercise Plan Mini-marches with green band for oblique recruitment, cat-cow, side-stretch    Consulted and Agree with Plan of Care Patient           Patient will benefit from skilled therapeutic intervention in order to improve the following deficits and impairments:     Visit Diagnosis: Other muscle spasm  Leg length discrepancy  Abnormal posture  Other idiopathic scoliosis, thoracic region     Problem List Patient Active Problem List   Diagnosis Date Noted  . Anxiety 02/21/2017   Cleophus Molt DPT, ATC Cleophus Molt 03/30/2020, 4:06 PM  [place] [facility] MAIN [facility] SERVICES [address] [place], [place], [address] Phone: [phone]   Fax:  [phone]  Name: Finn Altemose MRN: [id number] Date of Birth: 2000/05/05

## 2020-04-20 ENCOUNTER — Ambulatory Visit: Payer: 59 | Attending: Obstetrics & Gynecology

## 2020-04-20 ENCOUNTER — Other Ambulatory Visit: Payer: Self-pay

## 2020-04-20 DIAGNOSIS — M2170 Unequal limb length (acquired), unspecified site: Secondary | ICD-10-CM

## 2020-04-20 DIAGNOSIS — M62838 Other muscle spasm: Secondary | ICD-10-CM | POA: Diagnosis present

## 2020-04-20 DIAGNOSIS — R293 Abnormal posture: Secondary | ICD-10-CM

## 2020-04-20 DIAGNOSIS — M4124 Other idiopathic scoliosis, thoracic region: Secondary | ICD-10-CM

## 2020-04-20 NOTE — Therapy (Signed)
[place] MAIN [facility] SERVICES [address] [place], [place], [address] Phone: [phone]   Fax:  [phone]  Physical Therapy Treatment  The patient has been informed of current processes in place at Outpatient Rehab to protect patients from Covid-19 exposure including social distancing, schedule modifications, and new cleaning procedures. After discussing their particular risk with a therapist based on the patient's personal risk factors, the patient has decided to proceed with in-person therapy.   Patient Details  Name: Seleni Meller MRN: [id number] Date of Birth: [birth year] No data recorded  Encounter Date: 04/20/2020   PT End of Session - 04/20/20 1140    Visit Number 3    Number of Visits 12    Date for PT Re-Evaluation 06/17/20    Authorization Type [employer]    Authorization Time Period 03/25/20 through 06/17/20    Authorization - Visit Number 3    Authorization - Number of Visits 12    PT Start Time 6962    PT Stop Time 9528    PT Time Calculation (min) 60 min    Activity Tolerance Patient tolerated treatment well;No increased pain    Behavior During Therapy WFL for tasks assessed/performed           Past Medical History:  Diagnosis Date  . Anxiety   . IBS (irritable bowel syndrome)     Past Surgical History:  Procedure Laterality Date  . BREAST REDUCTION SURGERY Bilateral 09/2018  . HYMENECTOMY  11/2016    There were no vitals filed for this visit.   Pelvic Floor Physical Therapy Treatment Note  SCREENING  Changes in medications, allergies, or medical history?: none    SUBJECTIVE  Patient reports: Everything has been good except she has hip-flexor pain when she stands up or rolls over in bed. LB has been good.   Precautions:  Anxiety, prior hymenectomy, LLD  Pain update:  Location of pain:  Current pain:  0/10  Max pain:  5/10 (short and quick with position change) Least pain:  0/10 Nature of pain:  achy  ** Pt. Feels minimal discomfort on L near T/L junction and no other pain following treatment.  Patient Goals: To work on more internal release so she can comfortably have intercourse and reduce her back, pelvic, and LE pain.    OBJECTIVE  Changes in: Posture/Observations:  PSIS appear level in standing and supine. Hyperlordotic, rib-flare.  Range of Motion/Flexibilty:  Decreased upper thoracic spinal mobility  Strength/MMT:  LE MMT:  Pelvic floor:  Abdominal:  Pt. Continues to demonstrate weak obliques recruitment and rib-flare but able to recruit ~ 25% pre treatment, improved with release to multifidus.  Palpation: TTP to B Psoas, ~T12 multifidus, OI and Piriformis  Gait Analysis:  INTERVENTIONS THIS SESSION: Manual: Performed TP release and STM to B Psoas, ~T12 multifidus, OI and Piriformis followed by grade 3-4PA mobs to upper thoracic spine to decrease spasm and pain and allow for improved balance of musculature for improved function and decreased symptoms.  Dry-needle: Performed TPDN with a .30x63mm and a .30x32mm needle and standard approach as described below to decrease spasm and pain and allow for improved balance of musculature for improved function and decreased symptoms.  Therex: Educated on and practiced prone hip EXT with pillow under hips and reviewed hook-lying posterior pelvic tilts with obliques recruitment to improve strength of muscles opposing tight musculature to allow reciprocal inhibition to improve balance of musculature surrounding the pelvis and improve overall posture for optimal musculature length-tension  relationship and function.   Total time: 60 min.                               PT Short Term Goals - 03/25/20 1408      PT SHORT TERM GOAL #1   Title Patient will demonstrate improved pelvic alignment and balance of musculature surrounding the pelvis to facilitate decreased PFM spasms and decrease pelvic pain.     Baseline R up-slip and L anterior rotation. spasms surrounding B hips.    Time 6    Period Weeks    Status New    Target Date 05/06/20      PT SHORT TERM GOAL #2   Title Pt. will demonstrate deep-core coordination and timing with functional motions including push, pull, squat, lunge and lifting motions to prevent recurrence of PFM spasms.    Baseline pt. has great difficulty coordinating her deep-core for mini-march exercise previously learned due to muscular imbalance surrounding pelvis.    Time 6    Period Weeks    Status New    Target Date 05/06/20      PT SHORT TERM GOAL #3   Title Patient will demonstrate full HEP x1 IND in the clinic with appropriate performance to demonstrate understanding and proper form to allow for further improvement.    Baseline Pt. has history of poor follow-through with HEP leading to Sx. recurrence.    Time 6    Period Weeks    Status New    Target Date 05/06/20             PT Long Term Goals - 03/25/20 1414      PT LONG TERM GOAL #1   Title Patient will report no pain with intercourse to demonstrate improved functional ability.    Baseline Pain both with initial penetration and with deeper thrusting    Time 12    Period Weeks    Status New    Target Date 06/17/20      PT LONG TERM GOAL #2   Title Patient will describe pain no greater than 2/10 during 30-45 min of moderate-intensity exercise to demonstrate improved functional ability.    Baseline Having pain as high as 10/10 when bending forward over the last 3 weeks, occasiaonal "cramping" pain in the pelvis and ache in BLE for ~ 3 months.    Time 12    Period Weeks    Status New    Target Date 06/17/20      PT LONG TERM GOAL #3   Title Pt. will improve in FOTO score by 10 points to demonstrate improved function.    Baseline FOTO PFDI pain: 21% As of 5/5: 25%, Emailed FOTO survey 12/9.    Time 12    Period Weeks    Status New    Target Date 06/17/20      PT LONG TERM GOAL #4    Title Pt. will demonstrate ability to maintain decreased Sx across 2 weeks using stress management techniques, diet, and HEP independently to demonstrate improved function and condition management strategies.    Baseline Pt. seeks care when in school and at home but continues to have interruptions to her care and is not yet IND with symptom management strategies.    Time 12    Period Weeks    Status New    Target Date 06/17/20  Plan - 04/20/20 1141    Clinical Impression Statement Pt. Responded well to all interventions today, demonstrating improved hip EXT ROM with Pt. Able to demonstrate ~ 50% reduced lumbar extension and leg lying flat in supine, improved obliques and glute recruitment, as well as understanding and correct performance of all education and exercises provided today. They will continue to benefit from skilled physical therapy to work toward remaining goals and maximize function as well as decrease likelihood of symptom increase or recurrence.   PT Next Visit Plan review deep-core coordination and emphasize obliques and glute recruitment, review hip-flexor stretches, assess and release PFM.    PT Home Exercise Plan Mini-marches with green band for oblique recruitment, cat-cow, side-stretch, prone hip EXT over pillow    Consulted and Agree with Plan of Care Patient           Patient will benefit from skilled therapeutic intervention in order to improve the following deficits and impairments:     Visit Diagnosis: Other muscle spasm  Leg length discrepancy  Abnormal posture  Other idiopathic scoliosis, thoracic region     Problem List Patient Active Problem List   Diagnosis Date Noted  . Anxiety 02/21/2017   Cleophus Molt DPT, ATC Cleophus Molt 04/20/2020, 12:28 PM  [place] [facility] MAIN [facility] SERVICES [address] [place], [place], [address] Phone: [phone]   Fax:  [phone]  Name: Chanah Tidmore MRN: [id number] Date of Birth: September 19, 2000

## 2020-04-20 NOTE — Patient Instructions (Signed)
  Point your heel toward the ceiling (not toe as pictured) Exhale through pursed lips to get the TA to turn on then press up through the heel and feel the glutes turn on. Hold for 1 sec. Do 2x15 on each side.

## 2020-04-27 ENCOUNTER — Ambulatory Visit: Payer: 59

## 2020-05-04 ENCOUNTER — Ambulatory Visit: Payer: 59

## 2020-05-11 ENCOUNTER — Other Ambulatory Visit: Payer: Self-pay

## 2020-05-11 ENCOUNTER — Ambulatory Visit: Payer: 59

## 2020-05-11 DIAGNOSIS — M62838 Other muscle spasm: Secondary | ICD-10-CM | POA: Diagnosis not present

## 2020-05-11 DIAGNOSIS — M2170 Unequal limb length (acquired), unspecified site: Secondary | ICD-10-CM

## 2020-05-11 DIAGNOSIS — M4124 Other idiopathic scoliosis, thoracic region: Secondary | ICD-10-CM

## 2020-05-11 DIAGNOSIS — R293 Abnormal posture: Secondary | ICD-10-CM

## 2020-05-11 NOTE — Patient Instructions (Addendum)
Melodie Bouillon, MD Specialties and/or Subspecialties Gastroenterology, Internal Medicine, Hepatology [phone]  Child's Pose Pelvic Floor Lengthening    Sit in knee-chest position and reach arms forward. Separate knees for comfort. Hold position for _5__ breaths. Repeat _2-3__ times. Do _1-2__ times per day.  Urge supression technique:  1) Take a deep breath to convince yourself that you are in control and calm the nervous system.  2) Do 5 "quick-flick" kegels (pelvic floor muscle contractions) and re-assess the urge. Repeat another set if urge is still present. 3) Once the urge has decreased, start walking calmly to the the bathroom. Stop and repeat steps 1 and 2 as many times as needed until you can successfully get to the toilet. 4) Only once seated, take a deep breath and allow the pelvic floor muscles to relax and allow for the urine to flow.    Do not be discouraged if you are not successful the first couple times, this is normal and it will take practice but remember that YOU are in control. Start by practicing this at home where you do not have to worry as much if there were to be an accident. Allowing yourself to get rushed or nervous puts the bladder back in control and will not allow the technique to work.

## 2020-05-11 NOTE — Therapy (Incomplete)
[place] [facility] MAIN [facility] SERVICES [address] [place], [place], [address] Phone: [phone]   Fax:  [phone]  Physical Therapy Treatment  The patient has been informed of current processes in place at Outpatient Rehab to protect patients from Covid-19 exposure including social distancing, schedule modifications, and new cleaning procedures. After discussing their particular risk with a therapist based on the patient's personal risk factors, the patient has decided to proceed with in-person therapy.  Patient Details  Name: Judy Williamson MRN: [id number] Date of Birth: 2001/01/13 No data recorded  Encounter Date: 05/11/2020    Past Medical History:  Diagnosis Date  . Anxiety   . IBS (irritable bowel syndrome)     Past Surgical History:  Procedure Laterality Date  . BREAST REDUCTION SURGERY Bilateral 09/2018  . HYMENECTOMY  11/2016    There were no vitals filed for this visit.  Pelvic Floor Physical Therapy Treatment Note  SCREENING  Changes in medications, allergies, or medical history?: none    SUBJECTIVE  Patient reports: Has been having "a lot of gastro issues" the last 2 weeks. Has had some fecal smearing and FI lately and it seems like she is noticing more "undigested pieces" in stool but has been trying to eat more vegetables. Has had increased stress lately, especially around food and eating due to taking a class on eating disorders and has noticed that in the past she has had increased smearing when she was at home around family (which causes increased stress). Has only dilated once in the past 3 weeks. Used the [facility] one and it went in pretty easy. Has been doing exercises 2-3 times per week. LB does not hurt but she notices TTP on R posterior hip and occasionally has hip pain when she rolls in bed.  Precautions:  Anxiety, prior hymenectomy, LLD  Pain update:  Location of pain:  Current pain:  0/10  Max pain:  5/10  (short and quick with position change) Least pain:  0/10 Nature of pain: achy  ** no increased pain following treatment.  Patient Goals: To work on more internal release so she can comfortably have intercourse and reduce her back, pelvic, and LE pain.    OBJECTIVE  Changes in: Posture/Observations:  PSIS appear level in standing and supine. Hyperlordotic, rib-flare. (from prior session)  Range of Motion/Flexibilty:  Decreased upper thoracic spinal mobility (from prior session)  Strength/MMT:  LE MMT:    Pelvic floor: Not fully assessed today but Pt. Was able to tolerate digital insertion to deep layer of PFM and high amounts of tension palpated near posterior fourchette. Pt. Was able to tolerate TP release to allow for full relaxation through the posterior fourchette and following release was able to demonstrate coordinated squeeze and relax with VC. Strength ~ 3/5.  Abdominal:  Pt. Continues to demonstrate weak obliques recruitment and rib-flare but able to recruit ~ 25% pre treatment, improved with release to multifidus. (from previous session)  Palpation:  Gait Analysis:  INTERVENTIONS THIS SESSION: Manual: Performed TP release to posterior fourchette region to decrease spasm and pain, allow for deeper treatment, and allow for improved balance of musculature for improved function and decreased symptoms.  Therex: Reviewed and practiced Child's pose stretch To maintain and improve muscle length and allow for improved balance of musculature for long-term symptom relief.  Self-care: Discussed increased/new fecal smearing and likely connection with increased stress as well as increased PFM tension as a result of increased stress. Recommended discussing with her counselor  and her MD to determine if Anxiety is adequately addressed.   Total time: 60 min.                                PT Short Term Goals - 03/25/20 1408      PT SHORT TERM GOAL #1    Title Patient will demonstrate improved pelvic alignment and balance of musculature surrounding the pelvis to facilitate decreased PFM spasms and decrease pelvic pain.    Baseline R up-slip and L anterior rotation. spasms surrounding B hips.    Time 6    Period Weeks    Status New    Target Date 05/06/20      PT SHORT TERM GOAL #2   Title Pt. will demonstrate deep-core coordination and timing with functional motions including push, pull, squat, lunge and lifting motions to prevent recurrence of PFM spasms.    Baseline pt. has great difficulty coordinating her deep-core for mini-march exercise previously learned due to muscular imbalance surrounding pelvis.    Time 6    Period Weeks    Status New    Target Date 05/06/20      PT SHORT TERM GOAL #3   Title Patient will demonstrate full HEP x1 IND in the clinic with appropriate performance to demonstrate understanding and proper form to allow for further improvement.    Baseline Pt. has history of poor follow-through with HEP leading to Sx. recurrence.    Time 6    Period Weeks    Status New    Target Date 05/06/20             PT Long Term Goals - 04/20/20 1327      PT LONG TERM GOAL #1   Title Patient will report no pain with intercourse to demonstrate improved functional ability.    Baseline Pain both with initial penetration and with deeper thrusting    Time 12    Period Weeks    Status New    Target Date 06/17/20      PT LONG TERM GOAL #2   Title Patient will describe pain no greater than 2/10 during 30-45 min of moderate-intensity exercise to demonstrate improved functional ability.    Baseline Having pain as high as 10/10 when bending forward over the last 3 weeks, occasiaonal "cramping" pain in the pelvis and ache in BLE for ~ 3 months.    Time 12    Period Weeks    Status New    Target Date 06/17/20      PT LONG TERM GOAL #3   Title Pt. will improve in FOTO score by 10 points to demonstrate improved function.     Baseline FOTO PFDI pain: 21% As of 5/5: 25%, 12/9: PFDI Pain: 33, Bowel Cnst: 60, Bowel Leakage: 55, PFDI Bowel: 38    Time 12    Period Weeks    Status Revised    Target Date 06/17/20      PT LONG TERM GOAL #4   Title Pt. will demonstrate ability to maintain decreased Sx across 2 weeks using stress management techniques, diet, and HEP independently to demonstrate improved function and condition management strategies.    Baseline Pt. seeks care when in school and at home but continues to have interruptions to her care and is not yet IND with symptom management strategies.    Time 12    Period Weeks    Status New  Target Date 06/17/20                  Patient will benefit from skilled therapeutic intervention in order to improve the following deficits and impairments:     Visit Diagnosis: No diagnosis found.     Problem List Patient Active Problem List   Diagnosis Date Noted  . Anxiety 02/21/2017    Cleophus Molt 05/11/2020, 10:41 AM  [place] [facility] MAIN [facility] SERVICES [address] [place], [place], [address] Phone: [phone]   Fax:  [phone]  Name: Nusaiba Guallpa MRN: [id number] Date of Birth: Jul 03, 2000

## 2020-05-13 NOTE — Therapy (Signed)
[facility] MAIN [facility] SERVICES [address] [place], [place], [address] Phone: [phone]   Fax:  [phone]  Physical Therapy Treatment  The patient has been informed of current processes in place at [facility]patient [facility]hab to protect patients from Covid-19 exposure including social distancing, schedule modifications, and new cleaning procedures. After discussing their particular risk with a therapist based on the patient's personal risk factors, the patient has decided to proceed with in-person therapy.  Patient Details  Name: Judy Williamson MRN: [id number] Date of Birth: 05/31/2000 No data recorded  Encounter Date: 05/11/2020   PT End of Session - 05/13/20 0819    Visit Number 4    Number of Visits 12    Date for PT Re-Evaluation 06/17/20    Authorization Type [employer]    Authorization Time Period 03/25/20 through 06/17/20    Authorization - Visit Number 4    Authorization - Number of Visits 12    Progress Note Due on Visit 12    PT Start Time 1035    PT Stop Time 1130    PT Time Calculation (min) 55 min    Activity Tolerance Patient tolerated treatment well;No increased pain    Behavior During Therapy WFL for tasks assessed/performed           Past Medical History:  Diagnosis Date  . Anxiety   . IBS (irritable bowel syndrome)     Past Surgical History:  Procedure Laterality Date  . BREAST REDUCTION SURGERY Bilateral 09/2018  . HYMENECTOMY  11/2016    There were no vitals filed for this visit.  Pelvic Floor Physical Therapy Treatment Note  SCREENING  Changes in medications, allergies, or medical history?: none    SUBJECTIVE  Patient reports: Has been having "a lot of gastro issues" the last 2 weeks. Has had some fecal smearing and FI lately and it seems like she is noticing more "undigested pieces" in stool but has been trying to eat more vegetables. Has had increased stress lately, especially around food and eating due to  taking a class on eating disorders and has noticed that in the past she has had increased smearing when she was at home around family (which causes increased stress). Has only dilated once in the past 3 weeks. Used the [facility] one and it went in pretty easy. Has been doing exercises 2-3 times per week. LB does not hurt but she notices TTP on R posterior hip and occasionally has hip pain when she rolls in bed.  Precautions:  Anxiety, prior hymenectomy, LLD  Pain update:  Location of pain:  Current pain:  0/10  Max pain:  5/10 (short and quick with position change) Least pain:  0/10 Nature of pain: achy  ** no increased pain following treatment.  Patient Goals: To work on more internal release so she can comfortably have intercourse and reduce her back, pelvic, and LE pain.    OBJECTIVE  Changes in: Posture/Observations:  PSIS appear level in standing and supine. Hyperlordotic, rib-flare. (from prior session)  Range of Motion/Flexibilty:  Decreased upper thoracic spinal mobility (from prior session)  Strength/MMT:  LE MMT:    Pelvic floor: Not fully assessed today but Pt. Was able to tolerate digital insertion to deep layer of PFM and high amounts of tension palpated near posterior fourchette. Pt. Was able to tolerate TP release to allow for full relaxation through the posterior fourchette and following release was able to demonstrate coordinated squeeze and relax with VC. Strength ~  3/5.  Abdominal:  Pt. Continues to demonstrate weak obliques recruitment and rib-flare but able to recruit ~ 25% pre treatment, improved with release to multifidus. (from previous session)  Palpation:  Gait Analysis:  INTERVENTIONS THIS SESSION: Manual: Performed TP release to posterior fourchette region to decrease spasm and pain, allow for deeper treatment, and allow for improved balance of musculature for improved function and decreased symptoms.  Therex: Reviewed and practiced Child's pose  stretch To maintain and improve muscle length and allow for improved balance of musculature for long-term symptom relief.  Self-care: Discussed increased/new fecal smearing and likely connection with increased stress as well as increased PFM tension as a result of increased stress. Recommended discussing with her counselor and her MD to determine if Anxiety is adequately addressed.   Total time: 60 min.                                PT Short Term Goals - 03/25/20 1408      PT SHORT TERM GOAL #1   Title Patient will demonstrate improved pelvic alignment and balance of musculature surrounding the pelvis to facilitate decreased PFM spasms and decrease pelvic pain.    Baseline R up-slip and L anterior rotation. spasms surrounding B hips.    Time 6    Period Weeks    Status New    Target Date 05/06/20      PT SHORT TERM GOAL #2   Title Pt. will demonstrate deep-core coordination and timing with functional motions including push, pull, squat, lunge and lifting motions to prevent recurrence of PFM spasms.    Baseline pt. has great difficulty coordinating her deep-core for mini-march exercise previously learned due to muscular imbalance surrounding pelvis.    Time 6    Period Weeks    Status New    Target Date 05/06/20      PT SHORT TERM GOAL #3   Title Patient will demonstrate full HEP x1 IND in the clinic with appropriate performance to demonstrate understanding and proper form to allow for further improvement.    Baseline Pt. has history of poor follow-through with HEP leading to Sx. recurrence.    Time 6    Period Weeks    Status New    Target Date 05/06/20             PT Long Term Goals - 04/20/20 1327      PT LONG TERM GOAL #1   Title Patient will report no pain with intercourse to demonstrate improved functional ability.    Baseline Pain both with initial penetration and with deeper thrusting    Time 12    Period Weeks    Status New    Target  Date 06/17/20      PT LONG TERM GOAL #2   Title Patient will describe pain no greater than 2/10 during 30-45 min of moderate-intensity exercise to demonstrate improved functional ability.    Baseline Having pain as high as 10/10 when bending forward over the last 3 weeks, occasiaonal "cramping" pain in the pelvis and ache in BLE for ~ 3 months.    Time 12    Period Weeks    Status New    Target Date 06/17/20      PT LONG TERM GOAL #3   Title Pt. will improve in FOTO score by 10 points to demonstrate improved function.    Baseline FOTO PFDI pain: 21% As  of 5/5: 25%, 12/9: PFDI Pain: 33, Bowel Cnst: 60, Bowel Leakage: 55, PFDI Bowel: 38    Time 12    Period Weeks    Status Revised    Target Date 06/17/20      PT LONG TERM GOAL #4   Title Pt. will demonstrate ability to maintain decreased Sx across 2 weeks using stress management techniques, diet, and HEP independently to demonstrate improved function and condition management strategies.    Baseline Pt. seeks care when in school and at home but continues to have interruptions to her care and is not yet IND with symptom management strategies.    Time 12    Period Weeks    Status New    Target Date 06/17/20                 Plan - 05/13/20 0258    Clinical Impression Statement Pt. Responded well to all interventions today, demonstrating decreased PFM spasm and improved coordination with squeeze/relax as well as understanding and correct performance of all education and exercises provided today. They will continue to benefit from skilled physical therapy to work toward remaining goals and maximize function as well as decrease likelihood of symptom increase or recurrence.    PT Next Visit Plan review deep-core coordination and emphasize obliques and glute recruitment, review hip-flexor stretches, further assess and release PFM.    PT Home Exercise Plan Mini-marches with green band for oblique recruitment, cat-cow, side-stretch, prone  hip EXT over pillow, Child's pose.    Consulted and Agree with Plan of Care Patient           Patient will benefit from skilled therapeutic intervention in order to improve the following deficits and impairments:     Visit Diagnosis: Other muscle spasm  Leg length discrepancy  Abnormal posture  Other idiopathic scoliosis, thoracic region     Problem List Patient Active Problem List   Diagnosis Date Noted  . Anxiety 02/21/2017   Cleophus Molt DPT, ATC Cleophus Molt 05/13/2020, 8:21 AM  [place] [place] [facility] MAIN [facility] SERVICES [address] [place], [place], [address] Phone: [phone]   Fax:  [phone]  Name: Judy Williamson MRN: [id number] Date of Birth: 08-25-2000

## 2020-05-17 ENCOUNTER — Other Ambulatory Visit: Payer: Self-pay

## 2020-05-17 ENCOUNTER — Ambulatory Visit: Payer: 59

## 2020-05-17 DIAGNOSIS — M62838 Other muscle spasm: Secondary | ICD-10-CM | POA: Diagnosis not present

## 2020-05-17 DIAGNOSIS — R293 Abnormal posture: Secondary | ICD-10-CM

## 2020-05-17 DIAGNOSIS — M2170 Unequal limb length (acquired), unspecified site: Secondary | ICD-10-CM

## 2020-05-17 DIAGNOSIS — M4124 Other idiopathic scoliosis, thoracic region: Secondary | ICD-10-CM

## 2020-05-17 NOTE — Therapy (Signed)
[place] [facility] MAIN [facility] SERVICES [address] [place], [place], [address] Phone: [phone]   Fax:  [phone]  Physical Therapy Treatment  The patient has been informed of current processes in place at Outpatient Rehab to protect patients from Covid-19 exposure including social distancing, schedule modifications, and new cleaning procedures. After discussing their particular risk with a therapist based on the patient's personal risk factors, the patient has decided to proceed with in-person therapy.  Patient Details  Name: Judy Williamson MRN: [id number] Date of Birth: 2000-12-31 No data recorded  Encounter Date: 05/17/2020   PT End of Session - 05/17/20 1731    Visit Number 5    Number of Visits 12    Date for PT Re-Evaluation 06/17/20    Authorization Type [employer]    Authorization Time Period 03/25/20 through 06/17/20    Authorization - Visit Number 5    Authorization - Number of Visits 12    Progress Note Due on Visit 12    PT Start Time 1630    PT Stop Time 1730    PT Time Calculation (min) 60 min    Activity Tolerance Patient tolerated treatment well;No increased pain    Behavior During Therapy WFL for tasks assessed/performed           Past Medical History:  Diagnosis Date  . Anxiety   . IBS (irritable bowel syndrome)     Past Surgical History:  Procedure Laterality Date  . BREAST REDUCTION SURGERY Bilateral 09/2018  . HYMENECTOMY  11/2016    There were no vitals filed for this visit.   Pelvic Floor Physical Therapy Treatment Note  SCREENING  Changes in medications, allergies, or medical history?: none    SUBJECTIVE  Patient reports: She has had a really tough week so she has not done her HEP much. Is uncomfortable in her abdomen when she tries to do child's pose. She had to take a Zofran last night and it constipated her so she has not had a BM today. Over the last week BM's have been mushy and they just come. Her MD  wants her to get an X-ray because she is concerned that she may be backed up.   Precautions:  Anxiety, prior hymenectomy, LLD  Pain update:  Location of pain:  Current pain:  0/10  Max pain:  5/10 (short and quick with position change) Least pain:  0/10 Nature of pain: achy  ** no increased pain following treatment.  Patient Goals: To work on more internal release so she can comfortably have intercourse and reduce her back, pelvic, and LE pain.    OBJECTIVE  Changes in: Posture/Observations:  PSIS appear level in standing and supine. Hyperlordotic, rib-flare. (from prior session)  Range of Motion/Flexibilty:  Decreased upper thoracic spinal mobility (from prior session)  Strength/MMT:  LE MMT:    Pelvic floor: 05/11/20: Not fully assessed today but Pt. Was able to tolerate digital insertion to deep layer of PFM and high amounts of tension palpated near posterior fourchette. Pt. Was able to tolerate TP release to allow for full relaxation through the posterior fourchette and following release was able to demonstrate coordinated squeeze and relax with VC. Strength ~ 3/5.  TODAY: TTP to B PR/PC posteriorly  Abdominal:  Pt. Continues to demonstrate weak obliques recruitment and rib-flare but able to recruit ~ 25% pre treatment, improved with release to multifidus. (from previous session)  Palpation:  Gait Analysis:  INTERVENTIONS THIS SESSION: Manual: Performed TP release to  B PR/PC posteriorly to decrease spasm and pain, allow for deeper treatment, and allow for improved balance of musculature for improved function and decreased symptoms.  Total time: 60 min.                             PT Short Term Goals - 03/25/20 1408      PT SHORT TERM GOAL #1   Title Patient will demonstrate improved pelvic alignment and balance of musculature surrounding the pelvis to facilitate decreased PFM spasms and decrease pelvic pain.    Baseline R up-slip and  L anterior rotation. spasms surrounding B hips.    Time 6    Period Weeks    Status New    Target Date 05/06/20      PT SHORT TERM GOAL #2   Title Pt. will demonstrate deep-core coordination and timing with functional motions including push, pull, squat, lunge and lifting motions to prevent recurrence of PFM spasms.    Baseline pt. has great difficulty coordinating her deep-core for mini-march exercise previously learned due to muscular imbalance surrounding pelvis.    Time 6    Period Weeks    Status New    Target Date 05/06/20      PT SHORT TERM GOAL #3   Title Patient will demonstrate full HEP x1 IND in the clinic with appropriate performance to demonstrate understanding and proper form to allow for further improvement.    Baseline Pt. has history of poor follow-through with HEP leading to Sx. recurrence.    Time 6    Period Weeks    Status New    Target Date 05/06/20             PT Long Term Goals - 04/20/20 1327      PT LONG TERM GOAL #1   Title Patient will report no pain with intercourse to demonstrate improved functional ability.    Baseline Pain both with initial penetration and with deeper thrusting    Time 12    Period Weeks    Status New    Target Date 06/17/20      PT LONG TERM GOAL #2   Title Patient will describe pain no greater than 2/10 during 30-45 min of moderate-intensity exercise to demonstrate improved functional ability.    Baseline Having pain as high as 10/10 when bending forward over the last 3 weeks, occasiaonal "cramping" pain in the pelvis and ache in BLE for ~ 3 months.    Time 12    Period Weeks    Status New    Target Date 06/17/20      PT LONG TERM GOAL #3   Title Pt. will improve in FOTO score by 10 points to demonstrate improved function.    Baseline FOTO PFDI pain: 21% As of 5/5: 25%, 12/9: PFDI Pain: 33, Bowel Cnst: 60, Bowel Leakage: 55, PFDI Bowel: 38    Time 12    Period Weeks    Status Revised    Target Date 06/17/20       PT LONG TERM GOAL #4   Title Pt. will demonstrate ability to maintain decreased Sx across 2 weeks using stress management techniques, diet, and HEP independently to demonstrate improved function and condition management strategies.    Baseline Pt. seeks care when in school and at home but continues to have interruptions to her care and is not yet IND with symptom management strategies.    Time  12    Period Weeks    Status New    Target Date 06/17/20                 Plan - 05/17/20 1732    Clinical Impression Statement Pt. Responded well to all interventions today, demonstrating decreased PFM spasm and increased ease with digital penetration for internal assessment as well as understanding and correct performance of all education and exercises provided today. They will continue to benefit from skilled physical therapy to work toward remaining goals and maximize function as well as decrease likelihood of symptom increase or recurrence.    PT Next Visit Plan review deep-core coordination and emphasize obliques and glute recruitment, review hip-flexor stretches, further internal release to all B PFM other than posterior fourchette and PR/PC posteriorly.    PT Home Exercise Plan Mini-marches with green band for oblique recruitment, cat-cow, side-stretch, prone hip EXT over pillow, Child's pose.    Consulted and Agree with Plan of Care Patient           Patient will benefit from skilled therapeutic intervention in order to improve the following deficits and impairments:     Visit Diagnosis: Other muscle spasm  Leg length discrepancy  Abnormal posture  Other idiopathic scoliosis, thoracic region     Problem List Patient Active Problem List   Diagnosis Date Noted  . Anxiety 02/21/2017   Cleophus Molt DPT, ATC Cleophus Molt 05/17/2020, 5:34 PM  [place] [facility] MAIN [facility] SERVICES [address] [place], [place], [address] Phone: [phone]    Fax:  [phone]  Name: Judy Williamson MRN: [id number] Date of Birth: 06-24-2000

## 2020-05-18 ENCOUNTER — Ambulatory Visit: Payer: 59

## 2020-05-24 ENCOUNTER — Other Ambulatory Visit: Payer: Self-pay

## 2020-05-24 ENCOUNTER — Ambulatory Visit: Payer: 59 | Attending: Obstetrics & Gynecology

## 2020-05-24 DIAGNOSIS — M2170 Unequal limb length (acquired), unspecified site: Secondary | ICD-10-CM | POA: Diagnosis present

## 2020-05-24 DIAGNOSIS — R293 Abnormal posture: Secondary | ICD-10-CM | POA: Insufficient documentation

## 2020-05-24 DIAGNOSIS — M62838 Other muscle spasm: Secondary | ICD-10-CM | POA: Diagnosis not present

## 2020-05-24 DIAGNOSIS — M4124 Other idiopathic scoliosis, thoracic region: Secondary | ICD-10-CM | POA: Insufficient documentation

## 2020-05-24 NOTE — Therapy (Signed)
[place] [facility] MAIN [facility] SERVICES [address] [place], [place], [address] Phone: [phone]   Fax:  [phone]  Physical Therapy Treatment  The patient has been informed of current processes in place at [facility]patient [facility]hab to protect patients from Covid-19 exposure including social distancing, schedule modifications, and new cleaning procedures. After discussing their particular risk with a therapist based on the patient's personal risk factors, the patient has decided to proceed with in-person therapy.  Patient Details  Name: Judy Williamson MRN: [id number] Date of Birth: 2000/07/14 No data recorded  Encounter Date: 05/24/2020   PT End of Session - 05/24/20 1136    Visit Number 6    Number of Visits 12    Date for PT Re-Evaluation 06/17/20    Authorization Type [employer]    Authorization Time Period 03/25/20 through 06/17/20    Authorization - Visit Number 6    Authorization - Number of Visits 12    Progress Note Due on Visit 12    PT Start Time 1045    PT Stop Time 1130    PT Time Calculation (min) 45 min    Activity Tolerance Patient tolerated treatment well;No increased pain    Behavior During Therapy WFL for tasks assessed/performed           Past Medical History:  Diagnosis Date  . Anxiety   . IBS (irritable bowel syndrome)     Past Surgical History:  Procedure Laterality Date  . BREAST REDUCTION SURGERY Bilateral 09/2018  . HYMENECTOMY  11/2016    There were no vitals filed for this visit.  Pelvic Floor Physical Therapy Treatment Note  SCREENING  Changes in medications, allergies, or medical history?: none    SUBJECTIVE  Patient reports: Things are going well, she has not had LBP, has not dilated. Had a rough GI time over the weekend but she had a really large BM and knows what upset her stomach.   Precautions:  Anxiety, prior hymenectomy, LLD  Pain update:  Location of pain:  Current pain:  0/10  Max pain:  5/10  (short and quick with position change) Least pain:  0/10 Nature of pain: achy  ** no increased pain following treatment.  Patient Goals: To work on more internal release so she can comfortably have intercourse and reduce her back, pelvic, and LE pain.    OBJECTIVE  Changes in: Posture/Observations:  PSIS appear level in standing and supine. Hyperlordotic, rib-flare. (from prior session)  Range of Motion/Flexibilty:  Decreased upper thoracic spinal mobility (from prior session)  Strength/MMT:  LE MMT:    Pelvic floor: 05/11/20: Not fully assessed today but Pt. Was able to tolerate digital insertion to deep layer of PFM and high amounts of tension palpated near posterior fourchette. Pt. Was able to tolerate TP release to allow for full relaxation through the posterior fourchette and following release was able to demonstrate coordinated squeeze and relax with VC. Strength ~ 3/5.  05/17/20: TTP to B PR/PC posteriorly  TODAY: improved ease with initial insertion and faster/more comp,ete release. TTP to  B IC and PR/PC posteriorly. 3+/5 strength with greater recruitment from deeper fibers following release.  Abdominal:  Pt. Continues to demonstrate weak obliques recruitment and rib-flare but able to recruit ~ 25% pre treatment, improved with release to multifidus. (from previous session)  Palpation:  Gait Analysis:  INTERVENTIONS THIS SESSION: Manual: Performed further TP release to B PR/PC posteriorly and began working through IC to further decrease spasm and pain, allow  for deeper treatment, and allow for improved balance of musculature for improved function and decreased symptoms.  Therex: Replaced Child's  Pose with happy baby to encourage more regular performance due to Pt. Discomfort in her abdomen with performance. Educated on other hip-flexor stretch options and re-iterated the importance of doing one of them regularly and educated on an IT band stretch to help decrease  imbalance of musculature acting on the pelvis and allow for further strengthening into pelvic neutral to allow for maximal PFM relaxation.  Total time: 45 min.                               PT Short Term Goals - 03/25/20 1408      PT SHORT TERM GOAL #1   Title Patient will demonstrate improved pelvic alignment and balance of musculature surrounding the pelvis to facilitate decreased PFM spasms and decrease pelvic pain.    Baseline R up-slip and L anterior rotation. spasms surrounding B hips.    Time 6    Period Weeks    Status New    Target Date 05/06/20      PT SHORT TERM GOAL #2   Title Pt. will demonstrate deep-core coordination and timing with functional motions including push, pull, squat, lunge and lifting motions to prevent recurrence of PFM spasms.    Baseline pt. has great difficulty coordinating her deep-core for mini-march exercise previously learned due to muscular imbalance surrounding pelvis.    Time 6    Period Weeks    Status New    Target Date 05/06/20      PT SHORT TERM GOAL #3   Title Patient will demonstrate full HEP x1 IND in the clinic with appropriate performance to demonstrate understanding and proper form to allow for further improvement.    Baseline Pt. has history of poor follow-through with HEP leading to Sx. recurrence.    Time 6    Period Weeks    Status New    Target Date 05/06/20             PT Long Term Goals - 04/20/20 1327      PT LONG TERM GOAL #1   Title Patient will report no pain with intercourse to demonstrate improved functional ability.    Baseline Pain both with initial penetration and with deeper thrusting    Time 12    Period Weeks    Status New    Target Date 06/17/20      PT LONG TERM GOAL #2   Title Patient will describe pain no greater than 2/10 during 30-45 min of moderate-intensity exercise to demonstrate improved functional ability.    Baseline Having pain as high as 10/10 when bending  forward over the last 3 weeks, occasiaonal "cramping" pain in the pelvis and ache in BLE for ~ 3 months.    Time 12    Period Weeks    Status New    Target Date 06/17/20      PT LONG TERM GOAL #3   Title Pt. will improve in FOTO score by 10 points to demonstrate improved function.    Baseline FOTO PFDI pain: 21% As of 5/5: 25%, 12/9: PFDI Pain: 33, Bowel Cnst: 60, Bowel Leakage: 55, PFDI Bowel: 38    Time 12    Period Weeks    Status Revised    Target Date 06/17/20      PT LONG TERM GOAL #4  Title Pt. will demonstrate ability to maintain decreased Sx across 2 weeks using stress management techniques, diet, and HEP independently to demonstrate improved function and condition management strategies.    Baseline Pt. seeks care when in school and at home but continues to have interruptions to her care and is not yet IND with symptom management strategies.    Time 12    Period Weeks    Status New    Target Date 06/17/20                 Plan - 05/24/20 1139    Clinical Impression Statement Pt. Responded well to all interventions today, demonstrating improved PFM relaxation with greater ease upon initial penetration and faster TP release with a increase in PFM awareness and strength with kegel/relaxation practice, as well as understanding and correct performance of all education and exercises provided today. They will continue to benefit from skilled physical therapy to work toward remaining goals and maximize function as well as decrease likelihood of symptom increase or recurrence.    PT Next Visit Plan review deep-core coordination and emphasize obliques and glute recruitment, review hip-flexor stretches, further internal release to all B PFM other than posterior fourchette and PR/PC and IC posteriorly attempt MWM to mobilize coccyx.    PT Home Exercise Plan Mini-marches with green band for oblique recruitment, cat-cow, side-stretch, prone hip EXT over pillow, Child's pose.     Consulted and Agree with Plan of Care Patient           Patient will benefit from skilled therapeutic intervention in order to improve the following deficits and impairments:     Visit Diagnosis: Other muscle spasm  Leg length discrepancy  Abnormal posture  Other idiopathic scoliosis, thoracic region     Problem List Patient Active Problem List   Diagnosis Date Noted  . Anxiety 02/21/2017   Cleophus Molt DPT, ATC Cleophus Molt 05/24/2020, 11:41 AM  [place] [facility] MAIN [facility] SERVICES [address] [place], [place], [address] Phone: [phone]   Fax:  [phone]  Name: Judy Williamson MRN: [id number] Date of Birth: [birth year]

## 2020-05-24 NOTE — Patient Instructions (Signed)
     Bring both knees up to your chest and then hold the one farthest from the edge of the table/bed and let the other relax toward the floor until stretch is felt through the front of the hip.   Hold-relax: Gently lift the knee of the down leg up ~ 1/2 and inch and hold for 5 seconds, then let it relax all the way for a second and repeat 4 more times to decrease resting tension in the muscle.   Stretch: Let the leg relax and feel a stretch across the front of the hip/thigh as you take belly breaths. Hold for __5__ deep belly breaths. Relax. Repeat __2-3__ times per side.    Do this __1-2__ times per day.    Flexors, Lunge  Hip Flexor Stretch: Proposal Pose    Maintain pelvic tuck under, lift pubic bone toward navel. Engage posterior hip muscles (firm glute muscles of leg in back position) and shift forward until you feel stretch on front of leg that is down. To increase stretch, maintain balance and ease hips forward. You may use one hand on a chair for balance if needed. Hold for __5__ breaths. Repeat __2-3__ times each leg.  Do _1-2__ times per day.   Sit your bottom at the edge of the bed, pull your knee into your chest and keep it close as you lie back, feel the stretch down the front of the leg that is down. Hold for 5 deep breaths, repease 2-3 times for each side 1-2 times per day. You may use a towel to help hold the knee to your chest if needed.   Hold for 5 deep breaths, repeat 2-3 times, once daily.   Hold for 5 deep breaths, repeat 2-3 times, once daily.

## 2020-05-31 ENCOUNTER — Other Ambulatory Visit: Payer: Self-pay

## 2020-05-31 ENCOUNTER — Ambulatory Visit: Payer: 59

## 2020-05-31 DIAGNOSIS — M2170 Unequal limb length (acquired), unspecified site: Secondary | ICD-10-CM

## 2020-05-31 DIAGNOSIS — M62838 Other muscle spasm: Secondary | ICD-10-CM | POA: Diagnosis not present

## 2020-05-31 DIAGNOSIS — R293 Abnormal posture: Secondary | ICD-10-CM

## 2020-05-31 DIAGNOSIS — M4124 Other idiopathic scoliosis, thoracic region: Secondary | ICD-10-CM

## 2020-05-31 NOTE — Therapy (Signed)
[place] [facility] MAIN [facility] SERVICES [address] [place], [place], [address] Phone: [phone]   Fax:  [phone]  Physical Therapy Treatment  The patient has been informed of current processes in place at Outpatient Rehab to protect patients from Covid-19 exposure including social distancing, schedule modifications, and new cleaning procedures. After discussing their particular risk with a therapist based on the patient's personal risk factors, the patient has decided to proceed with in-person therapy.  Patient Details  Name: Judy Williamson MRN: [id number] Date of Birth: 2001/03/01 No data recorded  Encounter Date: 05/31/2020   PT End of Session - 05/31/20 1141    Visit Number 7    Number of Visits 12    Date for PT Re-Evaluation 06/17/20    Authorization Type [employer]    Authorization Time Period 03/25/20 through 06/17/20    Authorization - Visit Number 7    Authorization - Number of Visits 12    Progress Note Due on Visit 12    PT Start Time 1040    PT Stop Time 1133    PT Time Calculation (min) 53 min    Activity Tolerance Patient tolerated treatment well;No increased pain    Behavior During Therapy WFL for tasks assessed/performed           Past Medical History:  Diagnosis Date  . Anxiety   . IBS (irritable bowel syndrome)     Past Surgical History:  Procedure Laterality Date  . BREAST REDUCTION SURGERY Bilateral 09/2018  . HYMENECTOMY  11/2016    There were no vitals filed for this visit.  Pelvic Floor Physical Therapy Treatment Note  SCREENING  Changes in medications, allergies, or medical history?: none    SUBJECTIVE  Patient reports: She is taking a break from her birth control to see if she just needed to take a break and have a period but has not had much bleeding and has been off of it for ~ 1 week. She tried using her lidocaine when using a tampon and it helped some the fist day but did not help the second day. Has  not dilated this week. Has the tool but does not like to use it.   Precautions:  Anxiety, prior hymenectomy, LLD  Pain update:  Location of pain: LB Current pain:  0/10  Max pain:  0/10 (short and quick with position change) Least pain:  0/10 Nature of pain: achy  ** no increased pain following treatment.  Patient Goals: To work on more internal release so she can comfortably have intercourse and reduce her back, pelvic, and LE pain.    OBJECTIVE  Changes in: Posture/Observations:  PSIS appear level in standing and supine. Hyperlordotic, rib-flare. (from prior session)  Range of Motion/Flexibilty:  Decreased upper thoracic spinal mobility (from prior session)  Strength/MMT:  LE MMT:    Pelvic floor: 05/11/20: Not fully assessed today but Pt. Was able to tolerate digital insertion to deep layer of PFM and high amounts of tension palpated near posterior fourchette. Pt. Was able to tolerate TP release to allow for full relaxation through the posterior fourchette and following release was able to demonstrate coordinated squeeze and relax with VC. Strength ~ 3/5.  05/17/20: TTP to B PR/PC posteriorly  05/24/20: improved ease with initial insertion and faster/more comp,ete release. TTP to  B IC and PR/PC posteriorly. 3+/5 strength with greater recruitment from deeper fibers following release.  TODAY: TTP to R coccygeus and PR/PC anteriorly with referred sensation of bladder  fullness.   Abdominal:  Pt. Continues to demonstrate weak obliques recruitment and rib-flare but able to recruit ~ 25% pre treatment, improved with release to multifidus. (from previous session)  Palpation:  Gait Analysis:  INTERVENTIONS THIS SESSION: Manual: Performed further TP release to R coccygeus and PR/PC anteriorly to further decrease spasm and pain, allow for deeper treatment, and allow for improved balance of musculature for improved function and decreased symptoms.  Self-care: reviewed how to  perform self internal TP release and encouraged Pt. To try to be diligent for a 2 week period if possible to help encourage forward progress and allow for all of the PFM to be released at the same time. Pt. Expressed some resistance due to emotional response and was encouraged to explore these concerns with her counselor and only proceed with the plan if she feels safe or continue using the dilators if she feels more comfortable with that.  Total time: 53 min.                                PT Short Term Goals - 03/25/20 1408      PT SHORT TERM GOAL #1   Title Patient will demonstrate improved pelvic alignment and balance of musculature surrounding the pelvis to facilitate decreased PFM spasms and decrease pelvic pain.    Baseline R up-slip and L anterior rotation. spasms surrounding B hips.    Time 6    Period Weeks    Status New    Target Date 05/06/20      PT SHORT TERM GOAL #2   Title Pt. will demonstrate deep-core coordination and timing with functional motions including push, pull, squat, lunge and lifting motions to prevent recurrence of PFM spasms.    Baseline pt. has great difficulty coordinating her deep-core for mini-march exercise previously learned due to muscular imbalance surrounding pelvis.    Time 6    Period Weeks    Status New    Target Date 05/06/20      PT SHORT TERM GOAL #3   Title Patient will demonstrate full HEP x1 IND in the clinic with appropriate performance to demonstrate understanding and proper form to allow for further improvement.    Baseline Pt. has history of poor follow-through with HEP leading to Sx. recurrence.    Time 6    Period Weeks    Status New    Target Date 05/06/20             PT Long Term Goals - 04/20/20 1327      PT LONG TERM GOAL #1   Title Patient will report no pain with intercourse to demonstrate improved functional ability.    Baseline Pain both with initial penetration and with deeper thrusting     Time 12    Period Weeks    Status New    Target Date 06/17/20      PT LONG TERM GOAL #2   Title Patient will describe pain no greater than 2/10 during 30-45 min of moderate-intensity exercise to demonstrate improved functional ability.    Baseline Having pain as high as 10/10 when bending forward over the last 3 weeks, occasiaonal "cramping" pain in the pelvis and ache in BLE for ~ 3 months.    Time 12    Period Weeks    Status New    Target Date 06/17/20      PT LONG TERM GOAL #3  Title Pt. will improve in FOTO score by 10 points to demonstrate improved function.    Baseline FOTO PFDI pain: 21% As of 5/5: 25%, 12/9: PFDI Pain: 33, Bowel Cnst: 60, Bowel Leakage: 55, PFDI Bowel: 38    Time 12    Period Weeks    Status Revised    Target Date 06/17/20      PT LONG TERM GOAL #4   Title Pt. will demonstrate ability to maintain decreased Sx across 2 weeks using stress management techniques, diet, and HEP independently to demonstrate improved function and condition management strategies.    Baseline Pt. seeks care when in school and at home but continues to have interruptions to her care and is not yet IND with symptom management strategies.    Time 12    Period Weeks    Status New    Target Date 06/17/20                 Plan - 05/31/20 1141    Clinical Impression Statement Pt. Responded well to all interventions today, demonstrating improve ability to tolerate manual release and decreased spasm and TTP through R coccygeus and anterior PR/PC, as well as understanding and correct performance of all education and exercises provided today. They will continue to benefit from skilled physical therapy to work toward remaining goals and maximize function as well as decrease likelihood of symptom increase or recurrence.    PT Next Visit Plan review deep-core coordination and emphasize obliques and glute recruitment, review hip-flexor stretches, further internal release to all B PFM  other than posterior fourchette and PR/PC and IC posteriorly attempt MWM to mobilize coccyx.    PT Home Exercise Plan Mini-marches with green band for oblique recruitment, cat-cow, side-stretch, prone hip EXT over pillow, Child's pose.    Consulted and Agree with Plan of Care Patient           Patient will benefit from skilled therapeutic intervention in order to improve the following deficits and impairments:     Visit Diagnosis: Other muscle spasm  Leg length discrepancy  Abnormal posture  Other idiopathic scoliosis, thoracic region     Problem List Patient Active Problem List   Diagnosis Date Noted  . Anxiety 02/21/2017   Cleophus Molt DPT, ATC Cleophus Molt 05/31/2020, 11:42 AM  [place] [facility] MAIN [facility] SERVICES [address] [place], [place], [address] Phone: [phone]   Fax:  [phone]  Name: Niani Mourer MRN: [id number] Date of Birth: 2000/04/27

## 2020-06-07 ENCOUNTER — Ambulatory Visit: Payer: 59

## 2020-06-07 ENCOUNTER — Other Ambulatory Visit: Payer: Self-pay

## 2020-06-07 DIAGNOSIS — M2170 Unequal limb length (acquired), unspecified site: Secondary | ICD-10-CM

## 2020-06-07 DIAGNOSIS — M4124 Other idiopathic scoliosis, thoracic region: Secondary | ICD-10-CM

## 2020-06-07 DIAGNOSIS — M62838 Other muscle spasm: Secondary | ICD-10-CM | POA: Diagnosis not present

## 2020-06-07 DIAGNOSIS — R293 Abnormal posture: Secondary | ICD-10-CM

## 2020-06-07 NOTE — Patient Instructions (Signed)
  Take 5 deep breaths, allowing gravity to gently pull you into a deeper stretch with each breath. Repeat 2-3 times, once a day.  Self External Trigger Point Relief    1) Wash your hands and prop yourself up in a way where you can easily reach the vagina. You may wish to have a small hand-held mirror near by.  2) Use the 2 middle fingers to put gentle pressure on the three external pelvic floor muscles and hold pressure and take deep breaths as you allow the tension to release and discomfort to dissipate   3) Repeat the process for any trigger points you find spending between 3-10 minutes on this every 1-2 days until you do not find any more trigger points or you are told otherwise by your therapist.

## 2020-06-07 NOTE — Therapy (Signed)
[facility] MAIN [facility] SERVICES [address] [place], [place], [address] Phone: [phone]   Fax:  [phone]  Physical Therapy Treatment  The patient has been informed of current processes in place at [facility]patient [facility] to protect patients from Covid-19 exposure including social distancing, schedule modifications, and new cleaning procedures. After discussing their particular risk with a therapist based on the patient's personal risk factors, the patient has decided to proceed with in-person therapy.  Patient Details  Name: Judy Williamson MRN: [id number] Date of Birth: July 02, 2000 No data recorded  Encounter Date: 06/07/2020   PT End of Session - 06/07/20 1150    Visit Number 8    Number of Visits 12    Date for PT Re-Evaluation 06/17/20    Authorization Type [employer]    Authorization Time Period 03/25/20 through 06/17/20    Authorization - Visit Number 8    Authorization - Number of Visits 12    Progress Note Due on Visit 12    PT Start Time 1035    PT Stop Time 1135    PT Time Calculation (min) 60 min    Activity Tolerance Patient tolerated treatment well;No increased pain    Behavior During Therapy WFL for tasks assessed/performed           Past Medical History:  Diagnosis Date  . Anxiety   . IBS (irritable bowel syndrome)     Past Surgical History:  Procedure Laterality Date  . BREAST REDUCTION SURGERY Bilateral 09/2018  . HYMENECTOMY  11/2016    There were no vitals filed for this visit.   Pelvic Floor Physical Therapy Treatment Note  SCREENING  Changes in medications, allergies, or medical history?: none    SUBJECTIVE  Patient reports: Things are pretty good, she has noticed a little bit of back pain when she sleeps but not bad. She dilated 2 times this week and did her HEP 3 times. She tried to force herself to use the purple one too fast one time and it did not go well but the second time went ok when she allowed her  body more time to adjust.  Precautions:  Anxiety, prior hymenectomy, LLD  Pain update:  Location of pain: LB Current pain:  0/10  Max pain:  4/10 (short and quick with position change in bed) Least pain:  0/10 Nature of pain: achy  ** no increased pain following treatment.  Patient Goals: To work on more internal release so she can comfortably have intercourse and reduce her back, pelvic, and LE pain.    OBJECTIVE  Changes in: Posture/Observations:  PSIS appear level in standing and supine. Hyperlordotic, rib-flare. (from prior session)  Range of Motion/Flexibilty:  Decreased upper thoracic spinal mobility (from prior session)  Strength/MMT:  LE MMT:    Pelvic floor: 05/11/20: Not fully assessed today but Pt. Was able to tolerate digital insertion to deep layer of PFM and high amounts of tension palpated near posterior fourchette. Pt. Was able to tolerate TP release to allow for full relaxation through the posterior fourchette and following release was able to demonstrate coordinated squeeze and relax with VC. Strength ~ 3/5.  05/17/20: TTP to B PR/PC posteriorly  05/24/20: improved ease with initial insertion and faster/more comp,ete release. TTP to  B IC and PR/PC posteriorly. 3+/5 strength with greater recruitment from deeper fibers following release.  TODAY: TTP to B STP externally  Abdominal:  Pt. Continues to demonstrate weak obliques recruitment and rib-flare but able to recruit ~  25% pre treatment, improved with release to multifidus. (from previous session)  Palpation: TTP to B adductor brevis   Gait Analysis:  INTERVENTIONS THIS SESSION: Manual: Performed further TP release to B adductor brevis and STP to further decrease spasm and pain, allow for deeper treatment, and allow for improved balance of musculature for improved function and decreased symptoms.  Dry-needle: Performed TPDN with a .30x152mm needle and standard approach as described below to decrease  spasm and pain and allow for improved balance of musculature for improved function and decreased symptoms.  Therex: reviewed Mini-marches and educated on how to up-grade by starting to extend the leg after lifting to challenge her deep-core control and help stabilize the deep-core as well as how to make sure she is engaging the obliques to minimize rib-flare and engage the whole core.   Total time: 60 min.                      Trigger Point Dry Needling - 06/07/20 1200    Consent Given? Yes    Education Handout Provided No    Muscles Treated Lower Quadrant Adductor longus/brevis/magnus    Dry Needling Comments Bilateral    Adductor Response Twitch response elicited;Palpable increased muscle length                  PT Short Term Goals - 03/25/20 1408      PT SHORT TERM GOAL #1   Title Patient will demonstrate improved pelvic alignment and balance of musculature surrounding the pelvis to facilitate decreased PFM spasms and decrease pelvic pain.    Baseline R up-slip and L anterior rotation. spasms surrounding B hips.    Time 6    Period Weeks    Status New    Target Date 05/06/20      PT SHORT TERM GOAL #2   Title Pt. will demonstrate deep-core coordination and timing with functional motions including push, pull, squat, lunge and lifting motions to prevent recurrence of PFM spasms.    Baseline pt. has great difficulty coordinating her deep-core for mini-march exercise previously learned due to muscular imbalance surrounding pelvis.    Time 6    Period Weeks    Status New    Target Date 05/06/20      PT SHORT TERM GOAL #3   Title Patient will demonstrate full HEP x1 IND in the clinic with appropriate performance to demonstrate understanding and proper form to allow for further improvement.    Baseline Pt. has history of poor follow-through with HEP leading to Sx. recurrence.    Time 6    Period Weeks    Status New    Target Date 05/06/20              PT Long Term Goals - 04/20/20 1327      PT LONG TERM GOAL #1   Title Patient will report no pain with intercourse to demonstrate improved functional ability.    Baseline Pain both with initial penetration and with deeper thrusting    Time 12    Period Weeks    Status New    Target Date 06/17/20      PT LONG TERM GOAL #2   Title Patient will describe pain no greater than 2/10 during 30-45 min of moderate-intensity exercise to demonstrate improved functional ability.    Baseline Having pain as high as 10/10 when bending forward over the last 3 weeks, occasiaonal "cramping" pain in the pelvis and ache  in BLE for ~ 3 months.    Time 12    Period Weeks    Status New    Target Date 06/17/20      PT LONG TERM GOAL #3   Title Pt. will improve in FOTO score by 10 points to demonstrate improved function.    Baseline FOTO PFDI pain: 21% As of 5/5: 25%, 12/9: PFDI Pain: 33, Bowel Cnst: 60, Bowel Leakage: 55, PFDI Bowel: 38    Time 12    Period Weeks    Status Revised    Target Date 06/17/20      PT LONG TERM GOAL #4   Title Pt. will demonstrate ability to maintain decreased Sx across 2 weeks using stress management techniques, diet, and HEP independently to demonstrate improved function and condition management strategies.    Baseline Pt. seeks care when in school and at home but continues to have interruptions to her care and is not yet IND with symptom management strategies.    Time 12    Period Weeks    Status New    Target Date 06/17/20                 Plan - 06/07/20 1150    Clinical Impression Statement Pt. Responded well to all interventions today, demonstrating decreased spasm and TTP as well as understanding and correct performance of all education and exercises provided today. They will continue to benefit from skilled physical therapy to work toward remaining goals and maximize function as well as decrease likelihood of symptom increase or recurrence.    PT Next  Visit Plan review deep-core coordination and emphasize obliques and glute recruitment, review hip-flexor stretches, further internal release to all B PFM other than posterior fourchette and PR/PC and IC posteriorly attempt MWM to mobilize coccyx.    PT Home Exercise Plan Mini-marches with green band for oblique recruitment, cat-cow, side-stretch, prone hip EXT over pillow, Child's pose, frog stretch.    Consulted and Agree with Plan of Care Patient           Patient will benefit from skilled therapeutic intervention in order to improve the following deficits and impairments:     Visit Diagnosis: Other muscle spasm  Leg length discrepancy  Abnormal posture  Other idiopathic scoliosis, thoracic region     Problem List Patient Active Problem List   Diagnosis Date Noted  . Anxiety 02/21/2017   Cleophus Molt DPT, ATC Cleophus Molt 06/07/2020, 12:00 PM  [place] [facility] MAIN [facility] SERVICES [address] [place], [place], [address] Phone: [phone]   Fax:  [phone]  Name: Judy Williamson MRN: [id number] Date of Birth: 03/10/2001

## 2020-06-09 ENCOUNTER — Other Ambulatory Visit: Payer: Self-pay | Admitting: Clinical Nurse Specialist

## 2020-06-09 ENCOUNTER — Ambulatory Visit
Admission: RE | Admit: 2020-06-09 | Discharge: 2020-06-09 | Disposition: A | Discharge status: Home / Self Care | Payer: 59 | Attending: Clinical Nurse Specialist | Admitting: Clinical Nurse Specialist

## 2020-06-09 ENCOUNTER — Ambulatory Visit
Admission: RE | Admit: 2020-06-09 | Discharge: 2020-06-09 | Disposition: A | Discharge status: Home / Self Care | Payer: 59 | Source: Ambulatory Visit | Attending: Clinical Nurse Specialist | Admitting: Clinical Nurse Specialist

## 2020-06-09 ENCOUNTER — Other Ambulatory Visit: Payer: Self-pay

## 2020-06-09 DIAGNOSIS — R152 Fecal urgency: Secondary | ICD-10-CM | POA: Insufficient documentation

## 2020-06-09 DIAGNOSIS — R109 Unspecified abdominal pain: Secondary | ICD-10-CM | POA: Insufficient documentation

## 2020-06-09 DIAGNOSIS — R159 Full incontinence of feces: Secondary | ICD-10-CM

## 2020-06-14 ENCOUNTER — Ambulatory Visit: Payer: 59

## 2020-06-14 ENCOUNTER — Other Ambulatory Visit: Payer: Self-pay

## 2020-06-14 ENCOUNTER — Ambulatory Visit: Payer: Self-pay | Admitting: Gastroenterology

## 2020-06-14 DIAGNOSIS — M62838 Other muscle spasm: Secondary | ICD-10-CM | POA: Diagnosis not present

## 2020-06-14 DIAGNOSIS — M2170 Unequal limb length (acquired), unspecified site: Secondary | ICD-10-CM

## 2020-06-14 DIAGNOSIS — M4124 Other idiopathic scoliosis, thoracic region: Secondary | ICD-10-CM

## 2020-06-14 DIAGNOSIS — R293 Abnormal posture: Secondary | ICD-10-CM

## 2020-06-14 NOTE — Therapy (Signed)
[place] MAIN [facility] SERVICES [address] [place], [place], [address] Phone: [phone]   Fax:  [phone]  Physical Therapy Treatment  The patient has been informed of current processes in place at [facility]patient [facility]hab to protect patients from Covid-19 exposure including social distancing, schedule modifications, and new cleaning procedures. After discussing their particular risk with a therapist based on the patient's personal risk factors, the patient has decided to proceed with in-person therapy.  Patient Details  Name: Judy Williamson MRN: [id number] Date of Birth: [birth year] No data recorded  Encounter Date: 06/14/2020   PT End of Session - 06/14/20 1613    Visit Number 9    Number of Visits 12    Date for PT Re-Evaluation 06/17/20    Authorization Type [employer]    Authorization Time Period 03/25/20 through 06/17/20    Authorization - Visit Number 9    Authorization - Number of Visits 12    Progress Note Due on Visit 12    PT Start Time 6962    PT Stop Time 1140    PT Time Calculation (min) 60 min    Activity Tolerance Patient tolerated treatment well;No increased pain    Behavior During Therapy WFL for tasks assessed/performed           Past Medical History:  Diagnosis Date  . Anxiety   . IBS (irritable bowel syndrome)     Past Surgical History:  Procedure Laterality Date  . BREAST REDUCTION SURGERY Bilateral 09/2018  . HYMENECTOMY  11/2016    There were no vitals filed for this visit.  Pelvic Floor Physical Therapy Treatment Note  SCREENING  Changes in medications, allergies, or medical history?: none    SUBJECTIVE  Patient reports: She brought her tool today, had dilated 3 times and the purple dilator went in much easier than it usually does. Has had some LBP in sitting feels like it "is just always there". She has been walking on incline 12 and holding on.   Precautions:  Anxiety, prior hymenectomy, LLD  Pain  update:  Location of pain: LB Current pain:  1/10  Max pain:  4/10 (short and quick with position change in bed) Least pain:  0/10 Nature of pain: achy  ** no pain following treatment.  Patient Goals: To work on more internal release so she can comfortably have intercourse and reduce her back, pelvic, and LE pain.    OBJECTIVE  Changes in: Posture/Observations:  PSIS appear level in standing and supine. Hyperlordotic, rib-flare. (from prior session)  TODAY: Rib-flare and R anterior rotation.  **following treatment pelvis well aligned.   Range of Motion/Flexibilty:  Decreased upper thoracic spinal mobility (from prior session)  Strength/MMT:  LE MMT:    Pelvic floor: 05/11/20: Not fully assessed today but Pt. Was able to tolerate digital insertion to deep layer of PFM and high amounts of tension palpated near posterior fourchette. Pt. Was able to tolerate TP release to allow for full relaxation through the posterior fourchette and following release was able to demonstrate coordinated squeeze and relax with VC. Strength ~ 3/5.  05/17/20: TTP to B PR/PC posteriorly  05/24/20: improved ease with initial insertion and faster/more comp,ete release. TTP to  B IC and PR/PC posteriorly. 3+/5 strength with greater recruitment from deeper fibers following release.  2/21: TTP to B STP externally  TODAY: not re-assessed  Abdominal:  Pt. Continues to demonstrate weak obliques recruitment and rib-flare but able to recruit ~ 25% pre treatment, improved  with release to multifidus. (from previous session)  Palpation: TTP to R iliacus   Gait Analysis:  INTERVENTIONS THIS SESSION: Manual: Performed further TP release and STM to R iliacus followed by MET correction x2 for R anterior rotation to improve pelvic alignment, further decrease spasm and pain, allow for deeper treatment, and allow for improved balance of musculature for improved function and decreased symptoms.  Dry-needle:  Performed TPDN with a .30x146m needle and standard approach as described below to decrease spasm and pain and allow for improved balance of musculature for improved function and decreased symptoms.  Theract: educated on and practiced using self internal TP release tool to help with mental barrier and improve efficacy of HEP to allow for decreased spasm and pain with vaginal penetration. Educated on how to hinge at the hip and lean into the treadmill ratther than hold on to use under-utilized glutes and help prevent spasms in the LB and hip-flexors.  Total time: 60 min.                      Trigger Point Dry Needling - 06/14/20 0001    Consent Given? Yes    Education Handout Provided No    Muscles Treated Back/Hip Iliacus                  PT Short Term Goals - 03/25/20 1408      PT SHORT TERM GOAL #1   Title Patient will demonstrate improved pelvic alignment and balance of musculature surrounding the pelvis to facilitate decreased PFM spasms and decrease pelvic pain.    Baseline R up-slip and L anterior rotation. spasms surrounding B hips.    Time 6    Period Weeks    Status New    Target Date 05/06/20      PT SHORT TERM GOAL #2   Title Pt. will demonstrate deep-core coordination and timing with functional motions including push, pull, squat, lunge and lifting motions to prevent recurrence of PFM spasms.    Baseline pt. has great difficulty coordinating her deep-core for mini-march exercise previously learned due to muscular imbalance surrounding pelvis.    Time 6    Period Weeks    Status New    Target Date 05/06/20      PT SHORT TERM GOAL #3   Title Patient will demonstrate full HEP x1 IND in the clinic with appropriate performance to demonstrate understanding and proper form to allow for further improvement.    Baseline Pt. has history of poor follow-through with HEP leading to Sx. recurrence.    Time 6    Period Weeks    Status New    Target Date  05/06/20             PT Long Term Goals - 04/20/20 1327      PT LONG TERM GOAL #1   Title Patient will report no pain with intercourse to demonstrate improved functional ability.    Baseline Pain both with initial penetration and with deeper thrusting    Time 12    Period Weeks    Status New    Target Date 06/17/20      PT LONG TERM GOAL #2   Title Patient will describe pain no greater than 2/10 during 30-45 min of moderate-intensity exercise to demonstrate improved functional ability.    Baseline Having pain as high as 10/10 when bending forward over the last 3 weeks, occasiaonal "cramping" pain in the pelvis and ache in  BLE for ~ 3 months.    Time 12    Period Weeks    Status New    Target Date 06/17/20      PT LONG TERM GOAL #3   Title Pt. will improve in FOTO score by 10 points to demonstrate improved function.    Baseline FOTO PFDI pain: 21% As of 5/5: 25%, 12/9: PFDI Pain: 33, Bowel Cnst: 60, Bowel Leakage: 55, PFDI Bowel: 38    Time 12    Period Weeks    Status Revised    Target Date 06/17/20      PT LONG TERM GOAL #4   Title Pt. will demonstrate ability to maintain decreased Sx across 2 weeks using stress management techniques, diet, and HEP independently to demonstrate improved function and condition management strategies.    Baseline Pt. seeks care when in school and at home but continues to have interruptions to her care and is not yet IND with symptom management strategies.    Time 12    Period Weeks    Status New    Target Date 06/17/20                 Plan - 06/14/20 1614    Clinical Impression Statement Pt. Responded well to all interventions today, demonstrating improved pelvic alignment, decreased spasm and TTP, as well as understanding and correct performance of all education and exercises provided today. They will continue to benefit from skilled physical therapy to work toward remaining goals and maximize function as well as decrease likelihood  of symptom increase or recurrence.    PT Next Visit Plan review deep-core coordination and emphasize obliques and glute recruitment, review hip-flexor stretches, further internal release to all B PFM other than posterior fourchette and PR/PC and IC posteriorly attempt MWM to mobilize coccyx.    PT Home Exercise Plan Mini-marches with green band for oblique recruitment, cat-cow, side-stretch, prone hip EXT over pillow, Child's pose, frog stretch.    Consulted and Agree with Plan of Care Patient           Patient will benefit from skilled therapeutic intervention in order to improve the following deficits and impairments:     Visit Diagnosis: Other muscle spasm  Leg length discrepancy  Abnormal posture  Other idiopathic scoliosis, thoracic region     Problem List Patient Active Problem List   Diagnosis Date Noted  . Anxiety 02/21/2017   Willa Rough DPT, ATC Willa Rough 06/14/2020, 4:14 PM  [place] MAIN [facility] SERVICES [address] [place], [place], [address] Phone: [phone]   Fax:  [phone]  Name: Sarafina Puthoff MRN: [id number] Date of Birth: [birth year]

## 2020-06-17 ENCOUNTER — Ambulatory Visit: Payer: Self-pay | Admitting: Gastroenterology

## 2020-06-21 ENCOUNTER — Other Ambulatory Visit: Payer: Self-pay

## 2020-06-21 ENCOUNTER — Ambulatory Visit: Payer: 59 | Attending: Obstetrics & Gynecology

## 2020-06-21 DIAGNOSIS — R293 Abnormal posture: Secondary | ICD-10-CM | POA: Insufficient documentation

## 2020-06-21 DIAGNOSIS — M62838 Other muscle spasm: Secondary | ICD-10-CM | POA: Insufficient documentation

## 2020-06-21 DIAGNOSIS — M4124 Other idiopathic scoliosis, thoracic region: Secondary | ICD-10-CM | POA: Diagnosis present

## 2020-06-21 DIAGNOSIS — M2170 Unequal limb length (acquired), unspecified site: Secondary | ICD-10-CM | POA: Diagnosis present

## 2020-06-21 NOTE — Therapy (Signed)
[place] [facility] MAIN [facility] SERVICES [address] [place], [place], [address] Phone: [phone]   Fax:  [phone]  Physical Therapy Treatment  The patient has been informed of current processes in place at [facility]patient [facility]hab to protect patients from Covid-19 exposure including social distancing, schedule modifications, and new cleaning procedures. After discussing their particular risk with a therapist based on the patient's personal risk factors, the patient has decided to proceed with in-person therapy.   Patient Details  Name: Lynden Carrithers MRN: [id number] Date of Birth: [birth year] No data recorded  Encounter Date: 06/21/2020   PT End of Session - 06/21/20 1152    Visit Number 10    Number of Visits 12    Date for PT Re-Evaluation 06/17/20    Authorization Type [employer]    Authorization Time Period 03/25/20 through 06/17/20    Authorization - Visit Number 10    Authorization - Number of Visits 12    Progress Note Due on Visit 12    PT Start Time 1037    PT Stop Time 1130    PT Time Calculation (min) 53 min    Activity Tolerance Patient tolerated treatment well;No increased pain    Behavior During Therapy WFL for tasks assessed/performed           Past Medical History:  Diagnosis Date  . Anxiety   . IBS (irritable bowel syndrome)     Past Surgical History:  Procedure Laterality Date  . BREAST REDUCTION SURGERY Bilateral 09/2018  . HYMENECTOMY  11/2016    There were no vitals filed for this visit.   Pelvic Floor Physical Therapy Treatment Note  SCREENING  Changes in medications, allergies, or medical history?: none    SUBJECTIVE  Patient reports: She has dilated twice and used the tool once on the R side and tried to have sex one time which was really painful. She had pain in the R hip the two days after last treatment and it went away on it's own. She had to do a bowel clean out yesterday and had a BM every ~ 10 min. From 12 and  6pm.   Precautions:  Anxiety, prior hymenectomy, LLD  Pain update:  Location of pain: LB Current pain:  0/10  Max pain:  4/10 (short and quick with position change in bed) Least pain:  0/10 Nature of pain: achy  ** no pain following treatment.  Patient Goals: To work on more internal release so she can comfortably have intercourse and reduce her back, pelvic, and LE pain.    OBJECTIVE  Changes in: Posture/Observations:  PSIS appear level in standing and supine. Hyperlordotic, rib-flare. (from prior session)  TODAY: Rib-flare and R out-flare in standing  **Following treatment, Pt. Able to consistently recruit obliques with mini-marches to help improve deep-core coordination.  Range of Motion/Flexibilty:  Decreased upper thoracic spinal mobility (from prior session)  Decreased hip IR with pain in adductors/groin.  Strength/MMT:  LE MMT:    Pelvic floor: 05/11/20: Not fully assessed today but Pt. Was able to tolerate digital insertion to deep layer of PFM and high amounts of tension palpated near posterior fourchette. Pt. Was able to tolerate TP release to allow for full relaxation through the posterior fourchette and following release was able to demonstrate coordinated squeeze and relax with VC. Strength ~ 3/5.  05/17/20: TTP to B PR/PC posteriorly  05/24/20: improved ease with initial insertion and faster/more comp,ete release. TTP to  B IC and PR/PC posteriorly. 3+/5  strength with greater recruitment from deeper fibers following release.  2/21: TTP to B STP externally  TODAY: not re-assessed  Abdominal:  Pt. Continues to demonstrate weak obliques recruitment and rib-flare but able to recruit ~ 25% pre treatment, improved with release to multifidus. (from previous session)  Pt. Unable to recruit Obliques with mini-marches pre-treatment.   **successful recruitment in all exercises practiced today following release.  Palpation: TTP to B diaphragm.  Gait  Analysis:  INTERVENTIONS THIS SESSION: Manual: Performed further TP release to B diaphragm to allow for obliques recruitment and improved deep core coordination/balance and allow for improved balance of musculature for improved function and decreased symptoms.  Therex: Reviewed and practiced mini-marches with emphasis on control and obliques recruitment. Educated on and practiced planks and modified boat-pose to improve deep-core strength and improve balance of musculature to allow for decreased PFM tension.  Total time: 60 min.                               PT Short Term Goals - 03/25/20 1408      PT SHORT TERM GOAL #1   Title Patient will demonstrate improved pelvic alignment and balance of musculature surrounding the pelvis to facilitate decreased PFM spasms and decrease pelvic pain.    Baseline R up-slip and L anterior rotation. spasms surrounding B hips.    Time 6    Period Weeks    Status New    Target Date 05/06/20      PT SHORT TERM GOAL #2   Title Pt. will demonstrate deep-core coordination and timing with functional motions including push, pull, squat, lunge and lifting motions to prevent recurrence of PFM spasms.    Baseline pt. has great difficulty coordinating her deep-core for mini-march exercise previously learned due to muscular imbalance surrounding pelvis.    Time 6    Period Weeks    Status New    Target Date 05/06/20      PT SHORT TERM GOAL #3   Title Patient will demonstrate full HEP x1 IND in the clinic with appropriate performance to demonstrate understanding and proper form to allow for further improvement.    Baseline Pt. has history of poor follow-through with HEP leading to Sx. recurrence.    Time 6    Period Weeks    Status New    Target Date 05/06/20             PT Long Term Goals - 04/20/20 1327      PT LONG TERM GOAL #1   Title Patient will report no pain with intercourse to demonstrate improved functional ability.     Baseline Pain both with initial penetration and with deeper thrusting    Time 12    Period Weeks    Status New    Target Date 06/17/20      PT LONG TERM GOAL #2   Title Patient will describe pain no greater than 2/10 during 30-45 min of moderate-intensity exercise to demonstrate improved functional ability.    Baseline Having pain as high as 10/10 when bending forward over the last 3 weeks, occasiaonal "cramping" pain in the pelvis and ache in BLE for ~ 3 months.    Time 12    Period Weeks    Status New    Target Date 06/17/20      PT LONG TERM GOAL #3   Title Pt. will improve in FOTO score by 10  points to demonstrate improved function.    Baseline FOTO PFDI pain: 21% As of 5/5: 25%, 12/9: PFDI Pain: 33, Bowel Cnst: 60, Bowel Leakage: 55, PFDI Bowel: 38    Time 12    Period Weeks    Status Revised    Target Date 06/17/20      PT LONG TERM GOAL #4   Title Pt. will demonstrate ability to maintain decreased Sx across 2 weeks using stress management techniques, diet, and HEP independently to demonstrate improved function and condition management strategies.    Baseline Pt. seeks care when in school and at home but continues to have interruptions to her care and is not yet IND with symptom management strategies.    Time 12    Period Weeks    Status New    Target Date 06/17/20                 Plan - 06/21/20 1152    Clinical Impression Statement Pt. Responded well to all interventions today, demonstrating improved deep-core coordination and control, decreased spasm and TTP, understanding of the importance of strengthening her deep-core, as well as understanding and correct performance of all education and exercises provided today. They will continue to benefit from skilled physical therapy to work toward remaining goals and maximize function as well as decrease likelihood of symptom increase or recurrence.    PT Next Visit Plan review deep-core coordination and emphasize  obliques and glute recruitment, review hip-flexor stretches, further internal release to all B PFM other than posterior fourchette and PR/PC and IC posteriorly attempt MWM to mobilize coccyx.    PT Home Exercise Plan Mini-marches with green band for oblique recruitment, cat-cow, side-stretch, prone hip EXT over pillow, Child's pose, frog stretch, mini-marches, planks, boat pose.    Consulted and Agree with Plan of Care Patient           Patient will benefit from skilled therapeutic intervention in order to improve the following deficits and impairments:     Visit Diagnosis: Other muscle spasm  Leg length discrepancy  Abnormal posture  Other idiopathic scoliosis, thoracic region     Problem List Patient Active Problem List   Diagnosis Date Noted  . Anxiety 02/21/2017   Cleophus Molt DPT, ATC Cleophus Molt 06/21/2020, 12:03 PM  [place] [facility] MAIN [facility] SERVICES [address] [place], [place], [address] Phone: [phone]   Fax:  [phone]  Name: Dayanara Sherrill MRN: [id number] Date of Birth: 07-25-2000

## 2020-06-21 NOTE — Patient Instructions (Signed)
Boat Pose  1. Place fingers at/below belly button, in seated position like the photo below, tuck the pelvis under and feel the TA and obliques draw in.  2. Try to hold for about 30 seconds or until you feel fatigued/ body starts to compensate.  3. Use your hands to work your self back up. Feel stability under your feet.  4. Work your way up to being able to hold for ~1:30 and then start to challenge yourself by bringing your feet up and balancing      Start on all fours, tuck your hips under slightly and come forward until you are in a straight line. Make sure that your knees, your hips, and your shoulders are in a straight line. Hold for ~ 30 seconds, (or until you feel like you are too tired to hold it correctly). Repeat 2-3 times (or up to ~ 1:30 sec. Total).   As you get stronger, you can lengthen each hold. When you can hold for ~ 1:30 straight, try switching to on your toes instead of knees. You will need to decrease your hold time initially and build back up to the full 1:30.  Mini-Marches    Exhale, drawing the lower tummy (TA) in toward the back bone, keep exhaling until you feel the ribs draw in and hold contraction while you lift one foot ~ 2 inches off the mat, then the other foot before relaxing and resetting. Try to keep your hips from rocking, using your hands to sense whether they are staying even as pictured.      Perform _10__ repetitions for _3__ sets. Do this _1-2_ times per day.

## 2020-06-28 ENCOUNTER — Ambulatory Visit: Payer: 59

## 2020-07-05 ENCOUNTER — Ambulatory Visit: Payer: 59

## 2020-07-05 ENCOUNTER — Other Ambulatory Visit: Payer: Self-pay

## 2020-07-05 DIAGNOSIS — M62838 Other muscle spasm: Secondary | ICD-10-CM | POA: Diagnosis not present

## 2020-07-05 DIAGNOSIS — R293 Abnormal posture: Secondary | ICD-10-CM

## 2020-07-05 DIAGNOSIS — M2170 Unequal limb length (acquired), unspecified site: Secondary | ICD-10-CM

## 2020-07-05 DIAGNOSIS — M4124 Other idiopathic scoliosis, thoracic region: Secondary | ICD-10-CM

## 2020-07-05 NOTE — Therapy (Signed)
[place] [facility] MAIN [facility] SERVICES [address] [place], [place], [address] Phone: [phone]   Fax:  [phone]  Physical Therapy Treatment  The patient has been informed of current processes in place at Outpatient Rehab to protect patients from Covid-19 exposure including social distancing, schedule modifications, and new cleaning procedures. After discussing their particular risk with a therapist based on the patient's personal risk factors, the patient has decided to proceed with in-person therapy.   Patient Details  Name: Jariya Reichow MRN: [id number] Date of Birth: Aug 16, 2000 No data recorded  Encounter Date: 07/05/2020   PT End of Session - 07/05/20 1313    Visit Number 11    Number of Visits 12    Date for PT Re-Evaluation 06/17/20    Authorization Type [employer]    Authorization Time Period 03/25/20 through 06/17/20    Authorization - Visit Number 11    Authorization - Number of Visits 12    Progress Note Due on Visit 12    PT Start Time 1037    PT Stop Time 1137    PT Time Calculation (min) 60 min    Activity Tolerance Patient tolerated treatment well;No increased pain    Behavior During Therapy WFL for tasks assessed/performed           Past Medical History:  Diagnosis Date  . Anxiety   . IBS (irritable bowel syndrome)     Past Surgical History:  Procedure Laterality Date  . BREAST REDUCTION SURGERY Bilateral 09/2018  . HYMENECTOMY  11/2016    There were no vitals filed for this visit.    Pelvic Floor Physical Therapy Treatment Note  SCREENING  Changes in medications, allergies, or medical history?: none    SUBJECTIVE  Patient reports: She has dilated 3-4 times and used the tools 2 times, did her exercises 3-4 times. She has been pooping 3-4 times a day and it is almost always mushy. She has pain and urgency when she needs to have a BM.  Precautions:  Anxiety, prior hymenectomy, LLD  Pain update:  Location  of pain: LB Current pain:  0/10  Max pain:  0/10 (short and quick with position change in bed) Least pain:  0/10 Nature of pain: achy  ** no pain following treatment.  Patient Goals: To work on more internal release so she can comfortably have intercourse and reduce her back, pelvic, and LE pain.    OBJECTIVE  Changes in: Posture/Observations:  PSIS appear level in standing and supine. Hyperlordotic, rib-flare. (from prior session)  06/21/20: Rib-flare and R out-flare in standing  **Following treatment, Pt. Able to consistently recruit obliques with mini-marches to help improve deep-core coordination.  Range of Motion/Flexibilty:  Decreased upper thoracic spinal mobility (from prior session)  Decreased hip IR with pain in adductors/groin.  Strength/MMT:  LE MMT:    Pelvic floor: 05/11/20: Not fully assessed today but Pt. Was able to tolerate digital insertion to deep layer of PFM and high amounts of tension palpated near posterior fourchette. Pt. Was able to tolerate TP release to allow for full relaxation through the posterior fourchette and following release was able to demonstrate coordinated squeeze and relax with VC. Strength ~ 3/5.  05/17/20: TTP to B PR/PC posteriorly  05/24/20: improved ease with initial insertion and faster/more comp,ete release. TTP to  B IC and PR/PC posteriorly. 3+/5 strength with greater recruitment from deeper fibers following release.  2/21: TTP to B STP externally  TODAY: TTP to B PR/PC anteriorly  and IC on L. Greatest TTP at IC which re-created crampyness.  Abdominal:  Pt. Continues to demonstrate weak obliques recruitment and rib-flare but able to recruit ~ 25% pre treatment, improved with release to multifidus. (from previous session)  Pt. Unable to recruit Obliques with mini-marches pre-treatment.   **successful recruitment in all exercises practiced today following release. (from prior session)  Palpation: TTP to B diaphragm. (from  prior session)  Gait Analysis:  INTERVENTIONS THIS SESSION: Manual: Performed further TP release toto B PR/PC anteriorly and IC on L to decrease spasm and pain and allow for improved balance of musculature for improved function and decreased symptoms.  Self-care: Asked about BM's and encouraged pt. To consider a short trial of using psillium husk to help bulk her stools while keeping them moving to see if it would reduce urgency and discomfort with BM's while maintaining frequency and amount of BM's being passed. Instructed to D/C if she is not successful within 2-3 days.   Total time: 60 min.                              PT Short Term Goals - 03/25/20 1408      PT SHORT TERM GOAL #1   Title Patient will demonstrate improved pelvic alignment and balance of musculature surrounding the pelvis to facilitate decreased PFM spasms and decrease pelvic pain.    Baseline R up-slip and L anterior rotation. spasms surrounding B hips.    Time 6    Period Weeks    Status New    Target Date 05/06/20      PT SHORT TERM GOAL #2   Title Pt. will demonstrate deep-core coordination and timing with functional motions including push, pull, squat, lunge and lifting motions to prevent recurrence of PFM spasms.    Baseline pt. has great difficulty coordinating her deep-core for mini-march exercise previously learned due to muscular imbalance surrounding pelvis.    Time 6    Period Weeks    Status New    Target Date 05/06/20      PT SHORT TERM GOAL #3   Title Patient will demonstrate full HEP x1 IND in the clinic with appropriate performance to demonstrate understanding and proper form to allow for further improvement.    Baseline Pt. has history of poor follow-through with HEP leading to Sx. recurrence.    Time 6    Period Weeks    Status New    Target Date 05/06/20             PT Long Term Goals - 04/20/20 1327      PT LONG TERM GOAL #1   Title Patient will report no  pain with intercourse to demonstrate improved functional ability.    Baseline Pain both with initial penetration and with deeper thrusting    Time 12    Period Weeks    Status New    Target Date 06/17/20      PT LONG TERM GOAL #2   Title Patient will describe pain no greater than 2/10 during 30-45 min of moderate-intensity exercise to demonstrate improved functional ability.    Baseline Having pain as high as 10/10 when bending forward over the last 3 weeks, occasiaonal "cramping" pain in the pelvis and ache in BLE for ~ 3 months.    Time 12    Period Weeks    Status New    Target Date 06/17/20  PT LONG TERM GOAL #3   Title Pt. will improve in FOTO score by 10 points to demonstrate improved function.    Baseline FOTO PFDI pain: 21% As of 5/5: 25%, 12/9: PFDI Pain: 33, Bowel Cnst: 60, Bowel Leakage: 55, PFDI Bowel: 38    Time 12    Period Weeks    Status Revised    Target Date 06/17/20      PT LONG TERM GOAL #4   Title Pt. will demonstrate ability to maintain decreased Sx across 2 weeks using stress management techniques, diet, and HEP independently to demonstrate improved function and condition management strategies.    Baseline Pt. seeks care when in school and at home but continues to have interruptions to her care and is not yet IND with symptom management strategies.    Time 12    Period Weeks    Status New    Target Date 06/17/20                 Plan - 07/05/20 1314    Clinical Impression Statement Pt. Responded well to all interventions today, demonstrating improved PFM relaxation as well as understanding and correct performance of all education and exercises provided today. They will continue to benefit from skilled physical therapy to work toward remaining goals and maximize function as well as decrease likelihood of symptom increase or recurrence.    PT Next Visit Plan ask about psillium husk, review deep-core coordination and emphasize obliques and glute  recruitment, review hip-flexor stretches, further internal release to B coccygeus and OI, R IC, attempt MWM to mobilize coccyx.    PT Home Exercise Plan Mini-marches with green band for oblique recruitment, cat-cow, side-stretch, prone hip EXT over pillow, Child's pose, frog stretch, mini-marches, planks, boat pose.    Consulted and Agree with Plan of Care Patient           Patient will benefit from skilled therapeutic intervention in order to improve the following deficits and impairments:     Visit Diagnosis: Other muscle spasm  Leg length discrepancy  Abnormal posture  Other idiopathic scoliosis, thoracic region     Problem List Patient Active Problem List   Diagnosis Date Noted  . Anxiety 02/21/2017   Cleophus Molt DPT, ATC Cleophus Molt 07/05/2020, 1:16 PM  [place] [facility] MAIN [facility] SERVICES [address] [place], [place], [address] Phone: [phone]   Fax:  [phone]  Name: Karita Dralle MRN: [id number] Date of Birth: Oct 08, 2000

## 2020-07-12 ENCOUNTER — Other Ambulatory Visit: Payer: Self-pay

## 2020-07-12 ENCOUNTER — Ambulatory Visit: Payer: 59

## 2020-07-12 DIAGNOSIS — M4124 Other idiopathic scoliosis, thoracic region: Secondary | ICD-10-CM

## 2020-07-12 DIAGNOSIS — M62838 Other muscle spasm: Secondary | ICD-10-CM | POA: Diagnosis not present

## 2020-07-12 DIAGNOSIS — M2170 Unequal limb length (acquired), unspecified site: Secondary | ICD-10-CM

## 2020-07-12 DIAGNOSIS — R293 Abnormal posture: Secondary | ICD-10-CM

## 2020-07-12 NOTE — Therapy (Addendum)
[place] [facility] [address] [place], [place], [address] Phone: [phone]   Fax:  [phone]  Physical Therapy Progress Note   Dates of reporting period  03/25/20   to   07/12/20  The patient has been informed of current processes in place at [facility]patient [facility] to protect patients from Covid-19 exposure including social distancing, schedule modifications, and new cleaning procedures. After discussing their particular risk with a therapist based on the patient's personal risk factors, the patient has decided to proceed with in-person therapy.  Patient Details  Name: Aleira Deiter MRN: [id number] Date of Birth: January 25, 2001 No data recorded  Encounter Date: 07/12/2020   PT End of Session - 07/12/20 1716    Visit Number 12    Number of Visits 12    Date for PT Re-Evaluation 06/17/20    Authorization Type [employer]    Authorization Time Period 03/25/20 through 06/17/20    Authorization - Visit Number 12    Authorization - Number of Visits 12    Progress Note Due on Visit 12    PT Start Time 0109    PT Stop Time 1138    PT Time Calculation (min) 60 min    Activity Tolerance Patient tolerated treatment well;No increased pain    Behavior During Therapy WFL for tasks assessed/performed           Past Medical History:  Diagnosis Date  . Anxiety   . IBS (irritable bowel syndrome)     Past Surgical History:  Procedure Laterality Date  . BREAST REDUCTION SURGERY Bilateral 09/2018  . HYMENECTOMY  11/2016    There were no vitals filed for this visit.   Pelvic Floor Physical Therapy Treatment Note  SCREENING  Changes in medications, allergies, or medical history?: none    SUBJECTIVE  Patient reports: She has dilated 2 times, did her Ab exercises 3 times and tried to use the tool but stopped. She is able to adjust to the purple one within ~ 3 min. Has no plan of ever using blue.   Precautions:  Anxiety, prior hymenectomy,  LLD  Pain update:  Location of pain: LB Current pain:  0/10  Max pain:  0/10 (short and quick with position change in bed) Least pain:  0/10 Nature of pain: achy  ** no pain following treatment.  Patient Goals: To work on more internal release so she can comfortably have intercourse and reduce her back, pelvic, and LE pain.    OBJECTIVE  Changes in: Posture/Observations:  PSIS appear level in standing and supine. Hyperlordotic, rib-flare. (from prior session)  06/21/20: Rib-flare and R out-flare in standing  **Following treatment, Pt. Able to consistently recruit obliques with mini-marches to help improve deep-core coordination.  Range of Motion/Flexibilty:  Decreased upper thoracic spinal mobility (from prior session)  Decreased hip IR with pain in adductors/groin.  Strength/MMT:  LE MMT:    Pelvic floor: 05/11/20: Not fully assessed today but Pt. Was able to tolerate digital insertion to deep layer of PFM and high amounts of tension palpated near posterior fourchette. Pt. Was able to tolerate TP release to allow for full relaxation through the posterior fourchette and following release was able to demonstrate coordinated squeeze and relax with VC. Strength ~ 3/5.  05/17/20: TTP to B PR/PC posteriorly  05/24/20: improved ease with initial insertion and faster/more comp,ete release. TTP to  B IC and PR/PC posteriorly. 3+/5 strength with greater recruitment from deeper fibers following release.  2/21: TTP  to B STP externally  TODAY: TTP to B PR/PC anteriorly and IC on L. Greatest TTP at IC which re-created crampyness.  Abdominal:  Pt. Continues to demonstrate weak obliques recruitment and rib-flare but able to recruit ~ 25% pre treatment, improved with release to multifidus. (from previous session)  Pt. Unable to recruit Obliques with mini-marches pre-treatment.   **successful recruitment in all exercises practiced today following release. (from prior  session)  Palpation: TTP to B diaphragm. (from prior session)  Gait Analysis:  INTERVENTIONS THIS SESSION: Manual: Performed further TP release toto B PR/PC anteriorly and IC on L to decrease spasm and pain and allow for improved balance of musculature for improved function and decreased symptoms.  Self-care: Asked about BM's and encouraged pt. To consider a short trial of using psillium husk to help bulk her stools while keeping them moving to see if it would reduce urgency and discomfort with BM's while maintaining frequency and amount of BM's being passed. Instructed to D/C if she is not successful within 2-3 days.   Total time: 60 min.                                PT Short Term Goals - 07/13/20 0933      PT SHORT TERM GOAL #1   Title Patient will demonstrate improved pelvic alignment and balance of musculature surrounding the pelvis to facilitate decreased PFM spasms and decrease pelvic pain.    Baseline R up-slip and L anterior rotation. spasms surrounding B hips.    Time 6    Period Weeks    Status Achieved    Target Date 05/06/20      PT SHORT TERM GOAL #2   Title Pt. will demonstrate deep-core coordination and timing with functional motions including push, pull, squat, lunge and lifting motions to prevent recurrence of PFM spasms.    Baseline pt. has great difficulty coordinating her deep-core for mini-march exercise previously learned due to muscular imbalance surrounding pelvis.    Time 6    Period Weeks    Status Achieved    Target Date 05/06/20      PT SHORT TERM GOAL #3   Title Patient will demonstrate full HEP x1 IND in the clinic with appropriate performance to demonstrate understanding and proper form to allow for further improvement.    Baseline Pt. has history of poor follow-through with HEP leading to Sx. recurrence.    Time 6    Period Weeks    Status Partially Met    Target Date 05/06/20             PT Long Term Goals -  07/13/20 0933      PT LONG TERM GOAL #1   Title Patient will report no pain with intercourse to demonstrate improved functional ability.    Baseline Pain both with initial penetration and with deeper thrusting    Time 12    Period Weeks    Status On-going    Target Date 09/07/20      PT LONG TERM GOAL #2   Title Patient will describe pain no greater than 2/10 during 30-45 min of moderate-intensity exercise to demonstrate improved functional ability.    Baseline Having pain as high as 10/10 when bending forward over the last 3 weeks, occasiaonal "cramping" pain in the pelvis and ache in BLE for ~ 3 months.    Time 12    Period Weeks  Status Achieved    Target Date 09/07/20      PT LONG TERM GOAL #3   Title Pt. will improve in FOTO score by 10 points to demonstrate improved function.    Baseline FOTO PFDI pain: 21% As of 5/5: 25%, 12/9: PFDI Pain: 33, Bowel Cnst: 60, Bowel Leakage: 55, PFDI Bowel: 38    Time 12    Period Weeks    Status On-going    Target Date 09/07/20      PT LONG TERM GOAL #4   Title Pt. will demonstrate ability to maintain decreased Sx across 2 weeks using stress management techniques, diet, and HEP independently to demonstrate improved function and condition management strategies.    Baseline Pt. seeks care when in school and at home but continues to have interruptions to her care and is not yet IND with symptom management strategies.    Time 12    Period Weeks    Status On-going    Target Date 09/07/20                 Plan - 07/13/20 0926    Clinical Impression Statement Pt. has made slow but steady progress due to discomfort with performing self internal TP release and travel for holidays. She is demonstrating decreased TTP with initial penetration and improved ease with dilator use and is able to insert the second-from largest dilator finally (started with smallest). She is having less severe and frequent pain and only described minimal discomfort  in the RLQ this past week that was fleeting. She continues to demonstrate scar tissue and spasm as well as a rib-flare. Her BM's are currently consistent, though loose with the regular use of mirilax and are expected to improve with addition of fiber as well as further PFM release to allow for improved ease of emptying. We will continue for 8 more visits at 1 time per week for build on this progress and work toward full achievement of her goals.    Personal Factors and Comorbidities Comorbidity 2    Comorbidities Anxiety, IBD    Examination-Participation Restrictions Interpersonal Relationship;Other    Stability/Clinical Decision Making Evolving/Moderate complexity    Clinical Decision Making Moderate    Rehab Potential Good    PT Frequency 1x / week    PT Duration 8 weeks    PT Treatment/Interventions ADLs/Self Care Home Management;Biofeedback;Electrical Stimulation;Moist Heat;Traction;Ultrasound;Functional mobility training;Gait training;Therapeutic activities;Therapeutic exercise;Balance training;Neuromuscular re-education;Orthotic Fit/Training;Patient/family education;Passive range of motion;Dry needling;Spinal Manipulations;Joint Manipulations    PT Next Visit Plan ask about psillium husk, review deep-core coordination and emphasize obliques and glute recruitment, review hip-flexor stretches, further internal release to B coccygeus and OI, R IC, attempt MWM to mobilize coccyx.    PT Home Exercise Plan Mini-marches with green band for oblique recruitment, cat-cow, side-stretch, prone hip EXT over pillow, Child's pose, frog stretch, mini-marches, planks, boat pose.    Consulted and Agree with Plan of Care Patient           Patient will benefit from skilled therapeutic intervention in order to improve the following deficits and impairments:  Increased muscle spasms,Improper body mechanics,Decreased activity tolerance,Decreased coordination,Decreased strength,Increased fascial  restricitons,Impaired flexibility,Pain,Postural dysfunction  Visit Diagnosis: Other muscle spasm  Leg length discrepancy  Abnormal posture  Other idiopathic scoliosis, thoracic region     Problem List Patient Active Problem List   Diagnosis Date Noted  . Anxiety 02/21/2017   Willa Rough DPT, ATC Willa Rough 07/13/2020, 9:34 AM  [place]  [place], [place], [address] Phone: [phone]   Fax:  [phone]  Name: Tehani Mersman MRN: [id number] Date of Birth: 01-Nov-2000

## 2020-07-13 NOTE — Addendum Note (Signed)
Addended by: Flora Lipps T on: 07/13/2020 09:36 AM   Modules accepted: Orders

## 2020-07-19 ENCOUNTER — Other Ambulatory Visit: Payer: Self-pay

## 2020-07-19 ENCOUNTER — Ambulatory Visit: Payer: 59 | Attending: Obstetrics & Gynecology

## 2020-07-19 DIAGNOSIS — M2170 Unequal limb length (acquired), unspecified site: Secondary | ICD-10-CM | POA: Diagnosis present

## 2020-07-19 DIAGNOSIS — M62838 Other muscle spasm: Secondary | ICD-10-CM | POA: Diagnosis present

## 2020-07-19 DIAGNOSIS — R293 Abnormal posture: Secondary | ICD-10-CM | POA: Insufficient documentation

## 2020-07-19 DIAGNOSIS — M4124 Other idiopathic scoliosis, thoracic region: Secondary | ICD-10-CM | POA: Insufficient documentation

## 2020-07-19 NOTE — Patient Instructions (Signed)
 Your Vagina is Not Cussing! One of the most fascinating things I've learned as a pelvic floor physical therapist is that women really have a variety of ways that they wash their crotch. Should that be "crotches"? Can you make that plural? If not, why not? Tell me that.. But, cleaning the crotch - it's important. We clean our face, our armpits and our feet. The crotch has got a lot going on so it should be cleaned too, right? Women clean themselves differently, but that's not necessarily okay. There are some basic facts that are important to know when it comes to keeping your machine well-oiled and running, regardless of whether she's a 1957 [employer] [place] or a 2015 Honda Element; cuz either way she's a beauty, right? So what is the right way for a woman to clean her vulvovaginal area in order to ensure cleanliness, odor reduction and avoidance of infection? Let's start with what I hear from patients: 1. "I usually douche because that's what my mother did." 2. "I use a lavender scented soap all over my body and I get a wash rag and scrub my vulva." 3. "I spread my labias and get soap on them and then I put soap inside my vagina. I'm very clean." 4. "I'm careful, so I go front to back with the soap. I start at the vulva and soap it real good, then I reach over to my anus and get that soapy." 5. "I use a loofa on my vulva and then after I shower I spray a little perfume down there. You never know what's going to happen that day." Friends, Romans, Countrywomen - lend me your ear! All these people are WRONG! (and that's probably one reason why they're seeing me in the first place) If you want my advice, I'm going to be succinct, clear and direct. You can wash your vulvovaginal area any way you'd like as long as you are in the shower, eliminate all soap and let warm water run over the area and only use your hands. Just call me the [employer] of the vagina.or is that weird?  Here's what I want you to  do: 1. Wash your hair. 2. Wash your body with soap. 3. Rinse everything off. 4. Let warm water rinse over your labias. Yes, you can spread your labias. 5. Let warm water rinse over your anus. 6. Get out of the shower.** 7. Gently and lovingly pat the vulva dry and put on white, cotton underwear. **You can wash your hands before getting out. So why am I so restricting? Here's why: 1. The vagina is self-cleansing. There is no need to douche or soap inside the vagina. It's already got a good bacteria called lactobacilli that has several important functions. Lactobacilli eats up bad bacteria that can cause infection, it keeps the vagina acidic in order to reduce the likelihood of infections and it's even postulated that lactobacilli can prompt the immune system. This helps reduce odor, infection and keeps the natural flora healthy. Oh, and get this - estrogen helps to feed lactobacilli. So if you're low on estrogen, it makes sense that you might be prone to more infections. Please, just don't use soap on the vulva or in the vagina. Trust me, your vagina is not cussing. (Ironically enough, the inside of the mouth is made up of the same durable tissue as the inside of the vagina.) 2. The vulva wasn't meant to be scrubbed - it's not a potato. The vulva is   sensitive like your fingertips, the skin around your eyes and your lips. It's meant to detect fine detail (for pleasure), so being forceful with it is going to make it more sensitive in a negative way - hypersensitive (for pain). Scrubbing can remove a fine layer of the vulvovaginal tissue which can create an anti-histamine response - much like scrubbing your arm would make your arm red. This creates an inflammatory cascade of events. Many people will heal from this quite quickly and may not notice any discomfort, but others may start to notice some irritation after some time. This is when you might start noticing sensitivity to things that never bothered you  before like tight clothes, colorful underwear, lubricants or laundry detergent. 3. Scented items (or items with chemicals) like perfume (on the vulva), soap, bubble bath or even flavored or hot/cold/tingly/prickly/naughty sexual lubricant/condoms should be avoided as well because they could irritate the opening of the vagina (the vulvar vestibule) or the vagina itself. The vulvar vestibule is made of up different tissue than the vagina (but the same tissue as the urethra and bladder), so it's possible that using chemical products here can cause pain and the symptoms of a urinary tract infection (UTI). 4. The vagina needs to breathe. Wearing tight, conforming clothing all the time or daily pantyliners can be suffocating to your vulvovaginal area and irritating to the skin. Give it a break sometimes and wear looser clothing and or no underwear at all (like at night). 5. If you have a sensitive vulva or are prone to a lot of symptoms of infections, consider wearing white, cotton underwear instead of the fancy stuff. Over time, it's possible to develop new allergies and unfortunately, some women develop allergies to synthetic materials and dyes in their underwear. This also means it's best to wash your underwear with a detergent that is made for sensitive skin and is free of chemicals. ** Note - we will expand this area in the near future (with Sara's blessings) to include other options for under wear or safe liners. Stay tuned!  And get this: Discharge doesn't mean you are dirty. Discharge is natural and comes from a variety of places, most of which are not the vagina itself. What you see on your underwear is a mix of oil and gland secretions from the vulva and it's also secretions from the uterus and the fallopian tubes. Discharge changes during different parts of the menstrual cycle because it serves different purposes. For example, when you are ovulating, the discharge is a different consistency so that sperm  can pass through it more easily. It's all normal and healthy. However, if it starts to change colors or smell really funky - this indicates a possible issue with an area that is potentially apart from the vagina. Soaping and scrubbing to [place] is not going to fix this - you really need to get checked out by a doctor in this situation. Taking care of the vulvovaginal tissue is easier than we want it to be. Less is more. So much more. Good, simple vulvovaginal hygiene means better flora (not fauna), reduced odor, less itching and less discomfort. So cheers to you and your polite vagina. That little number was raised right! -Sara K. Sauder PT, DPT    

## 2020-07-19 NOTE — Therapy (Signed)
[place] MAIN [facility] SERVICES [address] [place], [place], [address] Phone: [phone]   Fax:  [phone]  Physical Therapy Treatment  The patient has been informed of current processes in place at [facility]patient [facility]hab to protect patients from Covid-19 exposure including social distancing, schedule modifications, and new cleaning procedures. After discussing their particular risk with a therapist based on the patient's personal risk factors, the patient has decided to proceed with in-person therapy.  Patient Details  Name: Judy Williamson MRN: [id number] Date of Birth: 03/05/2001 No data recorded  Encounter Date: 07/19/2020   PT End of Session - 07/19/20 1603    Visit Number 13    Number of Visits 20    Date for PT Re-Evaluation 06/17/20    Authorization Type [employer]    Authorization Time Period 07/12/20 through 09/07/20    Authorization - Visit Number 13    Authorization - Number of Visits 20    Progress Note Due on Visit 20    PT Start Time 9977    PT Stop Time 1138    PT Time Calculation (min) 60 min    Activity Tolerance Patient tolerated treatment well;No increased pain    Behavior During Therapy WFL for tasks assessed/performed           Past Medical History:  Diagnosis Date  . Anxiety   . IBS (irritable bowel syndrome)     Past Surgical History:  Procedure Laterality Date  . BREAST REDUCTION SURGERY Bilateral 09/2018  . HYMENECTOMY  11/2016    There were no vitals filed for this visit.   Pelvic Floor Physical Therapy Treatment Note  SCREENING  Changes in medications, allergies, or medical history?: none    SUBJECTIVE  Patient reports: She has not had any pain, has only dilated once and it went well. She has done exercises 2-3 times this week. She sometimes gets pain near the coccyx if sitting on the floor.   Precautions:  Anxiety, prior hymenectomy, LLD  Pain update:  Location of pain: LB Current pain:  0/10   Max pain:  0/10 (short and quick with position change in bed) Least pain:  0/10 Nature of pain: achy  ** no pain following treatment.  Patient Goals: To work on more internal release so she can comfortably have intercourse and reduce her back, pelvic, and LE pain.    OBJECTIVE  Changes in: Posture/Observations:  PSIS appear level in standing and supine. Hyperlordotic, rib-flare. (from prior session)  06/21/20: Rib-flare and R out-flare in standing  **Following treatment, Pt. Able to consistently recruit obliques with mini-marches to help improve deep-core coordination.  Range of Motion/Flexibilty:  Decreased upper thoracic spinal mobility (from prior session)  Decreased hip IR with pain in adductors/groin.  Strength/MMT:  LE MMT:    Pelvic floor: 05/11/20: Not fully assessed today but Pt. Was able to tolerate digital insertion to deep layer of PFM and high amounts of tension palpated near posterior fourchette. Pt. Was able to tolerate TP release to allow for full relaxation through the posterior fourchette and following release was able to demonstrate coordinated squeeze and relax with VC. Strength ~ 3/5.  05/17/20: TTP to B PR/PC posteriorly  05/24/20: improved ease with initial insertion and faster/more comp,ete release. TTP to  B IC and PR/PC posteriorly. 3+/5 strength with greater recruitment from deeper fibers following release.  2/21: TTP to B STP externally  07/12/20: TTP to B PR/PC anteriorly and IC on L. Greatest TTP at IC which  re-created crampyness.  TODAY: TTP to IC on R and B OI   Abdominal:  Pt. Continues to demonstrate weak obliques recruitment and rib-flare but able to recruit ~ 25% pre treatment, improved with release to multifidus. (from previous session)  Pt. Unable to recruit Obliques with mini-marches pre-treatment.   **successful recruitment in all exercises practiced today following release. (from prior session)  Palpation: TTP to B diaphragm. (from  prior session)  Gait Analysis:  INTERVENTIONS THIS SESSION: Manual: Performed further TP release to IC on R and B OI to decrease spasm and pain and allow for improved balance of musculature for improved function and decreased symptoms.  Self-care: Educated on the importance of not using soap on the vulva for helping decrease risk of UTI or yeast infection and to minimize irritation to allow for decreased vulvar sensitivity.  Total time: 60 min.                                  PT Short Term Goals - 07/13/20 0933      PT SHORT TERM GOAL #1   Title Patient will demonstrate improved pelvic alignment and balance of musculature surrounding the pelvis to facilitate decreased PFM spasms and decrease pelvic pain.    Baseline R up-slip and L anterior rotation. spasms surrounding B hips.    Time 6    Period Weeks    Status Achieved    Target Date 05/06/20      PT SHORT TERM GOAL #2   Title Pt. will demonstrate deep-core coordination and timing with functional motions including push, pull, squat, lunge and lifting motions to prevent recurrence of PFM spasms.    Baseline pt. has great difficulty coordinating her deep-core for mini-march exercise previously learned due to muscular imbalance surrounding pelvis.    Time 6    Period Weeks    Status Achieved    Target Date 05/06/20      PT SHORT TERM GOAL #3   Title Patient will demonstrate full HEP x1 IND in the clinic with appropriate performance to demonstrate understanding and proper form to allow for further improvement.    Baseline Pt. has history of poor follow-through with HEP leading to Sx. recurrence.    Time 6    Period Weeks    Status Partially Met    Target Date 05/06/20             PT Long Term Goals - 07/13/20 0933      PT LONG TERM GOAL #1   Title Patient will report no pain with intercourse to demonstrate improved functional ability.    Baseline Pain both with initial penetration and with  deeper thrusting    Time 12    Period Weeks    Status On-going    Target Date 09/07/20      PT LONG TERM GOAL #2   Title Patient will describe pain no greater than 2/10 during 30-45 min of moderate-intensity exercise to demonstrate improved functional ability.    Baseline Having pain as high as 10/10 when bending forward over the last 3 weeks, occasiaonal "cramping" pain in the pelvis and ache in BLE for ~ 3 months.    Time 12    Period Weeks    Status Achieved    Target Date 09/07/20      PT LONG TERM GOAL #3   Title Pt. will improve in FOTO score by 10 points to  demonstrate improved function.    Baseline FOTO PFDI pain: 21% As of 5/5: 25%, 12/9: PFDI Pain: 33, Bowel Cnst: 60, Bowel Leakage: 55, PFDI Bowel: 38    Time 12    Period Weeks    Status On-going    Target Date 09/07/20      PT LONG TERM GOAL #4   Title Pt. will demonstrate ability to maintain decreased Sx across 2 weeks using stress management techniques, diet, and HEP independently to demonstrate improved function and condition management strategies.    Baseline Pt. seeks care when in school and at home but continues to have interruptions to her care and is not yet IND with symptom management strategies.    Time 12    Period Weeks    Status On-going    Target Date 09/07/20                 Plan - 07/19/20 1604    Clinical Impression Statement Pt. Responded well to all interventions today, demonstrating decreased spasm and TTP internally as well as understanding and correct performance of all education and exercises provided today. They will continue to benefit from skilled physical therapy to work toward remaining goals and maximize function as well as decrease likelihood of symptom increase or recurrence.    PT Next Visit Plan ask about psillium husk, review deep-core coordination and emphasize obliques and glute recruitment, review hip-flexor stretches, further internal release to B coccygeus and OI, attempt MWM  to mobilize coccyx.    PT Home Exercise Plan Mini-marches with green band for oblique recruitment, cat-cow, side-stretch, prone hip EXT over pillow, Child's pose, frog stretch, mini-marches, planks, boat pose.    Consulted and Agree with Plan of Care Patient           Patient will benefit from skilled therapeutic intervention in order to improve the following deficits and impairments:     Visit Diagnosis: Other muscle spasm  Leg length discrepancy  Abnormal posture  Other idiopathic scoliosis, thoracic region     Problem List Patient Active Problem List   Diagnosis Date Noted  . Anxiety 02/21/2017   Willa Rough DPT, ATC Willa Rough 07/19/2020, 4:10 PM  [place] MAIN [facility] SERVICES [address] [place], [place], [address] Phone: [phone]   Fax:  [phone]  Name: Emilee Market MRN: [id number] Date of Birth: Apr 01, 2001

## 2020-07-26 ENCOUNTER — Other Ambulatory Visit: Payer: Self-pay

## 2020-07-26 ENCOUNTER — Ambulatory Visit: Payer: 59

## 2020-07-26 DIAGNOSIS — M2170 Unequal limb length (acquired), unspecified site: Secondary | ICD-10-CM

## 2020-07-26 DIAGNOSIS — R293 Abnormal posture: Secondary | ICD-10-CM

## 2020-07-26 DIAGNOSIS — M62838 Other muscle spasm: Secondary | ICD-10-CM

## 2020-07-26 DIAGNOSIS — M4124 Other idiopathic scoliosis, thoracic region: Secondary | ICD-10-CM

## 2020-07-26 NOTE — Therapy (Signed)
[place] MAIN [facility] SERVICES [address] [place], [place], [address] Phone: [phone]   Fax:  [phone]  Physical Therapy Treatment  The patient has been informed of current processes in place at [facility]patient Rehab to protect patients from Covid-19 exposure including social distancing, schedule modifications, and new cleaning procedures. After discussing their particular risk with a therapist based on the patient's personal risk factors, the patient has decided to proceed with in-person therapy.   Patient Details  Name: Judy Williamson MRN: [id number] Date of Birth: [birth year] No data recorded  Encounter Date: 07/26/2020   PT End of Session - 07/26/20 1141    Visit Number 14    Number of Visits 20    Date for PT Re-Evaluation 09/07/20    Authorization Type [employer]    Authorization Time Period 07/12/20 through 09/07/20    Authorization - Visit Number 14    Authorization - Number of Visits 20    Progress Note Due on Visit 20    PT Start Time 1017    PT Stop Time 1136    PT Time Calculation (min) 60 min    Activity Tolerance Patient tolerated treatment well;No increased pain    Behavior During Therapy WFL for tasks assessed/performed           Past Medical History:  Diagnosis Date  . Anxiety   . IBS (irritable bowel syndrome)     Past Surgical History:  Procedure Laterality Date  . BREAST REDUCTION SURGERY Bilateral 09/2018  . HYMENECTOMY  11/2016    There were no vitals filed for this visit.   Pelvic Floor Physical Therapy Treatment Note  SCREENING  Changes in medications, allergies, or medical history?: none    SUBJECTIVE  Patient reports: She only dilated once this week, yesterday and it was not difficult. Has done her exercises 3 times this week.  Precautions:  Anxiety, prior hymenectomy, LLD  Pain update:  Location of pain: LB Current pain:  0/10  Max pain:  0/10 (short and quick with position change in  bed) Least pain:  0/10 Nature of pain: achy  ** no increased pain following treatment.  Patient Goals: To work on more internal release so she can comfortably have intercourse and reduce her back, pelvic, and LE pain. She finds it hard to sit still for long but is not comfortable with one leg crossed over.   OBJECTIVE  Changes in: Posture/Observations:  PSIS appear level in standing and supine. Hyperlordotic, rib-flare. (from prior session)  06/21/20: Rib-flare and R out-flare in standing  **Following treatment, Pt. Able to consistently recruit obliques with mini-marches to help improve deep-core coordination.  Range of Motion/Flexibilty:  Decreased upper thoracic spinal mobility (from prior session)  Decreased hip IR with pain in adductors/groin.  Strength/MMT:  LE MMT:    Pelvic floor: 05/11/20: Not fully assessed today but Pt. Was able to tolerate digital insertion to deep layer of PFM and high amounts of tension palpated near posterior fourchette. Pt. Was able to tolerate TP release to allow for full relaxation through the posterior fourchette and following release was able to demonstrate coordinated squeeze and relax with VC. Strength ~ 3/5.  05/17/20: TTP to B PR/PC posteriorly  05/24/20: improved ease with initial insertion and faster/more comp,ete release. TTP to  B IC and PR/PC posteriorly. 3+/5 strength with greater recruitment from deeper fibers following release.  2/21: TTP to B STP externally  07/12/20: TTP to B PR/PC anteriorly and IC on L. Greatest  TTP at IC which re-created crampyness.  07/19/20: TTP to IC on R and B OI   TODAY: TTP to B coccygeus  Abdominal:  Pt. Continues to demonstrate weak obliques recruitment and rib-flare but able to recruit ~ 25% pre treatment, improved with release to multifidus. (from previous session)  Pt. Unable to recruit Obliques with mini-marches pre-treatment.   **successful recruitment in all exercises practiced today following  release. (from prior session)  Palpation: TTP to B diaphragm. (from prior session)  Gait Analysis:  INTERVENTIONS THIS SESSION: Manual: Performed further TP release to coccygeus B to decrease spasm and pain and allow for improved balance of musculature for improved function and decreased symptoms.  Self-care: Encouraged Pt. To try preparation H for the cut/hemmorhoid she describes rectally.  Total time: 60 min.                               PT Short Term Goals - 07/13/20 0933      PT SHORT TERM GOAL #1   Title Patient will demonstrate improved pelvic alignment and balance of musculature surrounding the pelvis to facilitate decreased PFM spasms and decrease pelvic pain.    Baseline R up-slip and L anterior rotation. spasms surrounding B hips.    Time 6    Period Weeks    Status Achieved    Target Date 05/06/20      PT SHORT TERM GOAL #2   Title Pt. will demonstrate deep-core coordination and timing with functional motions including push, pull, squat, lunge and lifting motions to prevent recurrence of PFM spasms.    Baseline pt. has great difficulty coordinating her deep-core for mini-march exercise previously learned due to muscular imbalance surrounding pelvis.    Time 6    Period Weeks    Status Achieved    Target Date 05/06/20      PT SHORT TERM GOAL #3   Title Patient will demonstrate full HEP x1 IND in the clinic with appropriate performance to demonstrate understanding and proper form to allow for further improvement.    Baseline Pt. has history of poor follow-through with HEP leading to Sx. recurrence.    Time 6    Period Weeks    Status Partially Met    Target Date 05/06/20             PT Long Term Goals - 07/13/20 0933      PT LONG TERM GOAL #1   Title Patient will report no pain with intercourse to demonstrate improved functional ability.    Baseline Pain both with initial penetration and with deeper thrusting    Time 12     Period Weeks    Status On-going    Target Date 09/07/20      PT LONG TERM GOAL #2   Title Patient will describe pain no greater than 2/10 during 30-45 min of moderate-intensity exercise to demonstrate improved functional ability.    Baseline Having pain as high as 10/10 when bending forward over the last 3 weeks, occasiaonal "cramping" pain in the pelvis and ache in BLE for ~ 3 months.    Time 12    Period Weeks    Status Achieved    Target Date 09/07/20      PT LONG TERM GOAL #3   Title Pt. will improve in FOTO score by 10 points to demonstrate improved function.    Baseline FOTO PFDI pain: 21% As of 5/5: 25%,  12/9: PFDI Pain: 33, Bowel Cnst: 60, Bowel Leakage: 55, PFDI Bowel: 38    Time 12    Period Weeks    Status On-going    Target Date 09/07/20      PT LONG TERM GOAL #4   Title Pt. will demonstrate ability to maintain decreased Sx across 2 weeks using stress management techniques, diet, and HEP independently to demonstrate improved function and condition management strategies.    Baseline Pt. seeks care when in school and at home but continues to have interruptions to her care and is not yet IND with symptom management strategies.    Time 12    Period Weeks    Status On-going    Target Date 09/07/20                 Plan - 07/26/20 1141    Clinical Impression Statement Pt. Responded well to all interventions today, demonstrating decreased spasm and TTP internally, with much greater ease with initial insertion, as well as understanding and correct performance of all education and exercises provided today. They will continue to benefit from skilled physical therapy to work toward remaining goals and maximize function as well as decrease likelihood of symptom increase or recurrence.    PT Next Visit Plan ask about psillium husk, review deep-core coordination and emphasize obliques and glute recruitment, review hip-flexor stretches, re-assess PFM and further internal release to  B coccygeus and OI?, attempt MWM to mobilize coccyx.    PT Home Exercise Plan Mini-marches with green band for oblique recruitment, cat-cow, side-stretch, prone hip EXT over pillow, Child's pose, frog stretch, mini-marches, planks, boat pose.    Consulted and Agree with Plan of Care Patient           Patient will benefit from skilled therapeutic intervention in order to improve the following deficits and impairments:     Visit Diagnosis: Other muscle spasm  Leg length discrepancy  Abnormal posture  Other idiopathic scoliosis, thoracic region     Problem List Patient Active Problem List   Diagnosis Date Noted  . Anxiety 02/21/2017   Willa Rough DPT, ATC Willa Rough 07/26/2020, 11:45 AM  [place] MAIN [facility] SERVICES [address] [place], [place], [address] Phone: [phone]   Fax:  [phone]  Name: Judy Williamson MRN: [id number] Date of Birth: 01-24-2001

## 2020-08-02 ENCOUNTER — Ambulatory Visit: Payer: 59

## 2020-08-02 ENCOUNTER — Other Ambulatory Visit: Payer: Self-pay

## 2020-08-02 DIAGNOSIS — R293 Abnormal posture: Secondary | ICD-10-CM

## 2020-08-02 DIAGNOSIS — M62838 Other muscle spasm: Secondary | ICD-10-CM | POA: Diagnosis not present

## 2020-08-02 DIAGNOSIS — M4124 Other idiopathic scoliosis, thoracic region: Secondary | ICD-10-CM

## 2020-08-02 DIAGNOSIS — M2170 Unequal limb length (acquired), unspecified site: Secondary | ICD-10-CM

## 2020-08-02 NOTE — Therapy (Signed)
[place] MAIN [facility] SERVICES [address] [place], [place], [address] Phone: [phone]   Fax:  [phone]  Physical Therapy Treatment  The patient has been informed of current processes in place at Outpatient Rehab to protect patients from Covid-19 exposure including social distancing, schedule modifications, and new cleaning procedures. After discussing their particular risk with a therapist based on the patient's personal risk factors, the patient has decided to proceed with in-person therapy.  Patient Details  Name: Judy Williamson MRN: [id number] Date of Birth: Jul 13, 2000 No data recorded  Encounter Date: 08/02/2020   PT End of Session - 08/02/20 1644    Visit Number 15    Number of Visits 20    Date for PT Re-Evaluation 09/07/20    Authorization Type [employer]    Authorization Time Period 07/12/20 through 09/07/20    Authorization - Visit Number 15    Authorization - Number of Visits 20    Progress Note Due on Visit 20    PT Start Time 1030    PT Stop Time 1130    PT Time Calculation (min) 60 min    Activity Tolerance Patient tolerated treatment well;No increased pain    Behavior During Therapy WFL for tasks assessed/performed           Past Medical History:  Diagnosis Date  . Anxiety   . IBS (irritable bowel syndrome)     Past Surgical History:  Procedure Laterality Date  . BREAST REDUCTION SURGERY Bilateral 09/2018  . HYMENECTOMY  11/2016    There were no vitals filed for this visit.   Pelvic Floor Physical Therapy Treatment Note  SCREENING  Changes in medications, allergies, or medical history?: none    SUBJECTIVE  Patient reports: She has started running and her R knee is painful when she runs and sometimes now when sitting. Has not dilated all week.  Precautions:  Anxiety, prior hymenectomy, LLD  Pain update:  Location of pain: LB (R knee) Current pain:  0/10 (0/10) Max pain:  0/10 (5/10) Least pain:   0/10 (0/10) Nature of pain: achy  ** no increased pain following treatment.  Patient Goals: To work on more internal release so she can comfortably have intercourse and reduce her back, pelvic, and LE pain. She finds it hard to sit still for long but is not comfortable with one leg crossed over.   OBJECTIVE  Changes in: Posture/Observations:  PSIS appear level in standing and supine. Hyperlordotic, rib-flare. (from prior session)  06/21/20: Rib-flare and R out-flare in standing **Following treatment, Pt. Able to consistently recruit obliques with mini-marches to help improve deep-core coordination.  TODAY: ASIS and PSIS level but R patella lateral and R facing pelvis.  Range of Motion/Flexibilty:  Decreased upper thoracic spinal mobility (from prior session)  Decreased hip IR with pain in adductors/groin.  Strength/MMT:  LE MMT:    Pelvic floor: 05/11/20: Not fully assessed today but Pt. Was able to tolerate digital insertion to deep layer of PFM and high amounts of tension palpated near posterior fourchette. Pt. Was able to tolerate TP release to allow for full relaxation through the posterior fourchette and following release was able to demonstrate coordinated squeeze and relax with VC. Strength ~ 3/5.  05/17/20: TTP to B PR/PC posteriorly  05/24/20: improved ease with initial insertion and faster/more comp,ete release. TTP to  B IC and PR/PC posteriorly. 3+/5 strength with greater recruitment from deeper fibers following release.  2/21: TTP to B STP externally  07/12/20: TTP to B PR/PC anteriorly and IC on L. Greatest TTP at IC which re-created crampyness.  07/19/20: TTP to IC on R and B OI   07/26/20: TTP to B coccygeus  TODAY: TTP to R OI  Abdominal:  Pt. Continues to demonstrate weak obliques recruitment and rib-flare but able to recruit ~ 25% pre treatment, improved with release to multifidus. (from previous session)  Pt. Unable to recruit Obliques with mini-marches  pre-treatment.   **successful recruitment in all exercises practiced today following release. (from prior session)  Palpation: TTP to B diaphragm. (from prior session)  Gait Analysis:  INTERVENTIONS THIS SESSION: Manual: Performed further TP release to R OI internally and performed TP release and STM to R vastus lateralis, TFL, and Sartorius as well as static and dynamic cupping to R lateral/anterior thigh, to decrease spasm and pain, increase fascial mobilty and allow for improved balance of musculature for improved function and decreased symptoms.  Dr-needle: Performed TPDN with a .30x74m needle and standard approach as described below to decrease spasm and pain and allow for improved balance of musculature for improved function and decreased symptoms.  Total time: 60 min.                              PT Short Term Goals - 07/13/20 0933      PT SHORT TERM GOAL #1   Title Patient will demonstrate improved pelvic alignment and balance of musculature surrounding the pelvis to facilitate decreased PFM spasms and decrease pelvic pain.    Baseline R up-slip and L anterior rotation. spasms surrounding B hips.    Time 6    Period Weeks    Status Achieved    Target Date 05/06/20      PT SHORT TERM GOAL #2   Title Pt. will demonstrate deep-core coordination and timing with functional motions including push, pull, squat, lunge and lifting motions to prevent recurrence of PFM spasms.    Baseline pt. has great difficulty coordinating her deep-core for mini-march exercise previously learned due to muscular imbalance surrounding pelvis.    Time 6    Period Weeks    Status Achieved    Target Date 05/06/20      PT SHORT TERM GOAL #3   Title Patient will demonstrate full HEP x1 IND in the clinic with appropriate performance to demonstrate understanding and proper form to allow for further improvement.    Baseline Pt. has history of poor follow-through with HEP leading  to Sx. recurrence.    Time 6    Period Weeks    Status Partially Met    Target Date 05/06/20             PT Long Term Goals - 07/13/20 0933      PT LONG TERM GOAL #1   Title Patient will report no pain with intercourse to demonstrate improved functional ability.    Baseline Pain both with initial penetration and with deeper thrusting    Time 12    Period Weeks    Status On-going    Target Date 09/07/20      PT LONG TERM GOAL #2   Title Patient will describe pain no greater than 2/10 during 30-45 min of moderate-intensity exercise to demonstrate improved functional ability.    Baseline Having pain as high as 10/10 when bending forward over the last 3 weeks, occasiaonal "cramping" pain in the pelvis and ache in BLE for ~  3 months.    Time 12    Period Weeks    Status Achieved    Target Date 09/07/20      PT LONG TERM GOAL #3   Title Pt. will improve in FOTO score by 10 points to demonstrate improved function.    Baseline FOTO PFDI pain: 21% As of 5/5: 25%, 12/9: PFDI Pain: 33, Bowel Cnst: 60, Bowel Leakage: 55, PFDI Bowel: 38    Time 12    Period Weeks    Status On-going    Target Date 09/07/20      PT LONG TERM GOAL #4   Title Pt. will demonstrate ability to maintain decreased Sx across 2 weeks using stress management techniques, diet, and HEP independently to demonstrate improved function and condition management strategies.    Baseline Pt. seeks care when in school and at home but continues to have interruptions to her care and is not yet IND with symptom management strategies.    Time 12    Period Weeks    Status On-going    Target Date 09/07/20                 Plan - 08/02/20 1644    Clinical Impression Statement Pt. Responded well to all interventions today, demonstrating decreased spasm, improved fascial mobility, as well as understanding and correct performance of all education and exercises provided today. They will continue to benefit from skilled  physical therapy to work toward remaining goals and maximize function as well as decrease likelihood of symptom increase or recurrence.    PT Next Visit Plan ask about psillium husk, review deep-core coordination and emphasize obliques and glute recruitment, review hip-flexor stretches, re-assess PFM and further internal release to B coccygeus and OI?, attempt MWM to mobilize coccyx.    PT Home Exercise Plan Mini-marches with green band for oblique recruitment, cat-cow, side-stretch, prone hip EXT over pillow, Child's pose, frog stretch, mini-marches, planks, boat pose.    Consulted and Agree with Plan of Care Patient           Patient will benefit from skilled therapeutic intervention in order to improve the following deficits and impairments:     Visit Diagnosis: Other muscle spasm  Leg length discrepancy  Abnormal posture  Other idiopathic scoliosis, thoracic region     Problem List Patient Active Problem List   Diagnosis Date Noted  . Anxiety 02/21/2017   Willa Rough DPT, ATC Willa Rough 08/02/2020, 4:45 PM  [place] MAIN [facility] SERVICES [address] [place], [place], [address] Phone: [phone]   Fax:  [phone]  Name: Judy Williamson MRN: [id number] Date of Birth: 2001/01/03

## 2020-08-09 ENCOUNTER — Other Ambulatory Visit: Payer: Self-pay

## 2020-08-09 ENCOUNTER — Ambulatory Visit: Payer: 59

## 2020-08-09 DIAGNOSIS — M62838 Other muscle spasm: Secondary | ICD-10-CM

## 2020-08-09 DIAGNOSIS — M2170 Unequal limb length (acquired), unspecified site: Secondary | ICD-10-CM

## 2020-08-09 DIAGNOSIS — M4124 Other idiopathic scoliosis, thoracic region: Secondary | ICD-10-CM

## 2020-08-09 DIAGNOSIS — R293 Abnormal posture: Secondary | ICD-10-CM

## 2020-08-09 NOTE — Therapy (Signed)
[place] MAIN [facility] SERVICES [address] [place], [place], [address] Phone: [phone]   Fax:  [phone]  Physical Therapy Treatment  The patient has been informed of current processes in place at Outpatient Rehab to protect patients from Covid-19 exposure including social distancing, schedule modifications, and new cleaning procedures. After discussing their particular risk with a therapist based on the patient's personal risk factors, the patient has decided to proceed with in-person therapy.  Patient Details  Name: Judy Williamson MRN: [id number] Date of Birth: [birth year] No data recorded  Encounter Date: 08/09/2020   PT End of Session - 08/09/20 1203    Visit Number 16    Number of Visits 20    Date for PT Re-Evaluation 09/07/20    Authorization Type [employer]    Authorization Time Period 07/12/20 through 09/07/20    Authorization - Visit Number 16    Authorization - Number of Visits 20    Progress Note Due on Visit 20    PT Start Time 1324    PT Stop Time 1140    PT Time Calculation (min) 60 min    Activity Tolerance Patient tolerated treatment well;No increased pain    Behavior During Therapy WFL for tasks assessed/performed           Past Medical History:  Diagnosis Date  . Anxiety   . IBS (irritable bowel syndrome)     Past Surgical History:  Procedure Laterality Date  . BREAST REDUCTION SURGERY Bilateral 09/2018  . HYMENECTOMY  11/2016    There were no vitals filed for this visit.   Pelvic Floor Physical Therapy Treatment Note  SCREENING  Changes in medications, allergies, or medical history?: none    SUBJECTIVE  Patient reports: She has not tried running but has done her exercises and dilated twice this week, both went well. She is able to use the Purple dilator with relative ease.  Precautions:  Anxiety, prior hymenectomy, LLD  Pain update:  Location of pain: LB (R knee) Current pain:  0/10 (0/10) Max  pain:  0/10 (5/10) Least pain:  0/10 (0/10) Nature of pain: achy  ** no increased pain following treatment.  Patient Goals: To work on more internal release so she can comfortably have intercourse and reduce her back, pelvic, and LE pain. She finds it hard to sit still for long but is not comfortable with one leg crossed over.   OBJECTIVE  Changes in: Posture/Observations:  PSIS appear level in standing and supine. Hyperlordotic, rib-flare. (from prior session)  06/21/20: Rib-flare and R out-flare in standing **Following treatment, Pt. Able to consistently recruit obliques with mini-marches to help improve deep-core coordination.  08/02/20: ASIS and PSIS level but R patella lateral and R facing pelvis.  Range of Motion/Flexibilty:  Decreased upper thoracic spinal mobility (from prior session)  Decreased hip IR with pain in adductors/groin.  Strength/MMT:  LE MMT:    Pelvic floor: 05/11/20: Not fully assessed today but Pt. Was able to tolerate digital insertion to deep layer of PFM and high amounts of tension palpated near posterior fourchette. Pt. Was able to tolerate TP release to allow for full relaxation through the posterior fourchette and following release was able to demonstrate coordinated squeeze and relax with VC. Strength ~ 3/5.  05/17/20: TTP to B PR/PC posteriorly  05/24/20: improved ease with initial insertion and faster/more comp,ete release. TTP to  B IC and PR/PC posteriorly. 3+/5 strength with greater recruitment from deeper fibers following release.  2/21:  TTP to B STP externally  07/12/20: TTP to B PR/PC anteriorly and IC on L. Greatest TTP at IC which re-created crampyness.  07/19/20: TTP to IC on R and B OI   07/26/20: TTP to B coccygeus  08/02/20: TTP to R OI.  TODAY: TTP to all superficial layer PFM with some decreased mobility through the 2nd layer. TTP to L OI and B PC near tip of coccyx.  Abdominal:  Pt. Continues to demonstrate weak obliques  recruitment and rib-flare but able to recruit ~ 25% pre treatment, improved with release to multifidus. (from previous session)  Pt. Unable to recruit Obliques with mini-marches pre-treatment.   **successful recruitment in all exercises practiced today following release. (from prior session)  Palpation: TTP to B diaphragm. (from prior session)  Gait Analysis:  INTERVENTIONS THIS SESSION: Manual: Performed further TP release toto all superficial layer PFM with some decreased mobility through the 2nd layer. TTP to L OI and B PC near tip of coccyx to decrease spasm and pain, increase fascial mobilty and allow for improved balance of musculature for improved function and decreased symptoms.  Total time: 60 min.                              PT Short Term Goals - 07/13/20 0933      PT SHORT TERM GOAL #1   Title Patient will demonstrate improved pelvic alignment and balance of musculature surrounding the pelvis to facilitate decreased PFM spasms and decrease pelvic pain.    Baseline R up-slip and L anterior rotation. spasms surrounding B hips.    Time 6    Period Weeks    Status Achieved    Target Date 05/06/20      PT SHORT TERM GOAL #2   Title Pt. will demonstrate deep-core coordination and timing with functional motions including push, pull, squat, lunge and lifting motions to prevent recurrence of PFM spasms.    Baseline pt. has great difficulty coordinating her deep-core for mini-march exercise previously learned due to muscular imbalance surrounding pelvis.    Time 6    Period Weeks    Status Achieved    Target Date 05/06/20      PT SHORT TERM GOAL #3   Title Patient will demonstrate full HEP x1 IND in the clinic with appropriate performance to demonstrate understanding and proper form to allow for further improvement.    Baseline Pt. has history of poor follow-through with HEP leading to Sx. recurrence.    Time 6    Period Weeks    Status Partially  Met    Target Date 05/06/20             PT Long Term Goals - 07/13/20 0933      PT LONG TERM GOAL #1   Title Patient will report no pain with intercourse to demonstrate improved functional ability.    Baseline Pain both with initial penetration and with deeper thrusting    Time 12    Period Weeks    Status On-going    Target Date 09/07/20      PT LONG TERM GOAL #2   Title Patient will describe pain no greater than 2/10 during 30-45 min of moderate-intensity exercise to demonstrate improved functional ability.    Baseline Having pain as high as 10/10 when bending forward over the last 3 weeks, occasiaonal "cramping" pain in the pelvis and ache in BLE for ~ 3 months.  Time 12    Period Weeks    Status Achieved    Target Date 09/07/20      PT LONG TERM GOAL #3   Title Pt. will improve in FOTO score by 10 points to demonstrate improved function.    Baseline FOTO PFDI pain: 21% As of 5/5: 25%, 12/9: PFDI Pain: 33, Bowel Cnst: 60, Bowel Leakage: 55, PFDI Bowel: 38    Time 12    Period Weeks    Status On-going    Target Date 09/07/20      PT LONG TERM GOAL #4   Title Pt. will demonstrate ability to maintain decreased Sx across 2 weeks using stress management techniques, diet, and HEP independently to demonstrate improved function and condition management strategies.    Baseline Pt. seeks care when in school and at home but continues to have interruptions to her care and is not yet IND with symptom management strategies.    Time 12    Period Weeks    Status On-going    Target Date 09/07/20                 Plan - 08/09/20 1204    Clinical Impression Statement Pt. Responded well to all interventions today, demonstrating decreased TTP and spasm through all 1st and 2nd layer muscles and some deep PFM with greater release rate than noted previously and lower overall tension, as well as understanding and correct performance of all education and exercises provided today. They  will continue to benefit from skilled physical therapy to work toward remaining goals and maximize function as well as decrease likelihood of symptom increase or recurrence.   PT Next Visit Plan TPDN to B OI,  review deep-core coordination and emphasize obliques and glute recruitment, review hip-flexor stretches, re-assess PFM and further internal release to B coccygeus and OI?, attempt MWM to mobilize coccyx.    PT Home Exercise Plan Mini-marches with green band for oblique recruitment, cat-cow, side-stretch, prone hip EXT over pillow, Child's pose, frog stretch, mini-marches, planks, boat pose.    Consulted and Agree with Plan of Care Patient           Patient will benefit from skilled therapeutic intervention in order to improve the following deficits and impairments:     Visit Diagnosis: Other muscle spasm  Leg length discrepancy  Abnormal posture  Other idiopathic scoliosis, thoracic region     Problem List Patient Active Problem List   Diagnosis Date Noted  . Anxiety 02/21/2017   Willa Rough DPT, ATC Willa Rough 08/09/2020, 12:09 PM  [place] MAIN [facility] SERVICES [address] [place], [place], [address] Phone: [phone]   Fax:  [phone]  Name: Alizia Greif MRN: [id number] Date of Birth: [birth year]

## 2020-08-16 ENCOUNTER — Other Ambulatory Visit: Payer: Self-pay

## 2020-08-16 ENCOUNTER — Ambulatory Visit: Payer: 59 | Attending: Obstetrics & Gynecology

## 2020-08-16 DIAGNOSIS — R293 Abnormal posture: Secondary | ICD-10-CM | POA: Insufficient documentation

## 2020-08-16 DIAGNOSIS — M62838 Other muscle spasm: Secondary | ICD-10-CM | POA: Insufficient documentation

## 2020-08-16 DIAGNOSIS — M2170 Unequal limb length (acquired), unspecified site: Secondary | ICD-10-CM | POA: Insufficient documentation

## 2020-08-16 DIAGNOSIS — M4124 Other idiopathic scoliosis, thoracic region: Secondary | ICD-10-CM | POA: Diagnosis present

## 2020-08-16 NOTE — Patient Instructions (Signed)
Pigeon pose for Piriformis stretch      Sit up with a tall spine and cross one leg over the other knee. Hinge from the hip and lean until you can feel a stretch through your bottom hold for 5 deep breaths and then switch sides. Repeat 2-3 times on each side.   

## 2020-08-16 NOTE — Therapy (Signed)
[place] MAIN [facility] SERVICES [address] [place], [place], [address] Phone: [phone]   Fax:  [phone]  Physical Therapy Treatment  The patient has been informed of current processes in place at Outpatient Rehab to protect patients from Covid-19 exposure including social distancing, schedule modifications, and new cleaning procedures. After discussing their particular risk with a therapist based on the patient's personal risk factors, the patient has decided to proceed with in-person therapy.  Patient Details  Name: Judy Williamson MRN: [id number] Date of Birth: 11/21/2000 No data recorded  Encounter Date: 08/16/2020   PT End of Session - 08/16/20 1040    Visit Number 17    Number of Visits 20    Date for PT Re-Evaluation 09/07/20    Authorization Type [employer]    Authorization Time Period 07/12/20 through 09/07/20    Authorization - Visit Number 17    Authorization - Number of Visits 20    Progress Note Due on Visit 20    PT Start Time 2263    PT Stop Time 1135    PT Time Calculation (min) 60 min    Activity Tolerance Patient tolerated treatment well;No increased pain    Behavior During Therapy WFL for tasks assessed/performed           Past Medical History:  Diagnosis Date  . Anxiety   . IBS (irritable bowel syndrome)     Past Surgical History:  Procedure Laterality Date  . BREAST REDUCTION SURGERY Bilateral 09/2018  . HYMENECTOMY  11/2016    There were no vitals filed for this visit.  Pelvic Floor Physical Therapy Treatment Note  SCREENING  Changes in medications, allergies, or medical history?: none    SUBJECTIVE  Patient reports: She has only dilated once this week but felt like she could have benefited from doing it twice. She was able to insert purple dilator eventually and when she sneezed and it cam out it went back in with relative ease.  Precautions:  Anxiety, prior hymenectomy, LLD  Pain  update:  Location of pain: LB (R knee) Current pain:  0/10 (0/10) Max pain:  0/10 (5/10) Least pain:  0/10 (0/10) Nature of pain: achy  ** no increased pain following treatment.  Patient Goals: To work on more internal release so she can comfortably have intercourse and reduce her back, pelvic, and LE pain. She finds it hard to sit still for long but is not comfortable with one leg crossed over.   OBJECTIVE  Changes in: Posture/Observations:  PSIS appear level in standing and supine. Hyperlordotic, rib-flare. (from prior session)  06/21/20: Rib-flare and R out-flare in standing **Following treatment, Pt. Able to consistently recruit obliques with mini-marches to help improve deep-core coordination.  08/02/20: ASIS and PSIS level but R patella lateral and R facing pelvis.  Range of Motion/Flexibilty:  Decreased upper thoracic spinal mobility (from prior session)  Decreased hip IR with pain in adductors/groin.  Strength/MMT:  LE MMT:    Pelvic floor: 05/11/20: Not fully assessed today but Pt. Was able to tolerate digital insertion to deep layer of PFM and high amounts of tension palpated near posterior fourchette. Pt. Was able to tolerate TP release to allow for full relaxation through the posterior fourchette and following release was able to demonstrate coordinated squeeze and relax with VC. Strength ~ 3/5.  05/17/20: TTP to B PR/PC posteriorly  05/24/20: improved ease with initial insertion and faster/more comp,ete release. TTP to  B IC and PR/PC posteriorly. 3+/5  strength with greater recruitment from deeper fibers following release.  2/21: TTP to B STP externally  07/12/20: TTP to B PR/PC anteriorly and IC on L. Greatest TTP at IC which re-created crampyness.  07/19/20: TTP to IC on R and B OI   07/26/20: TTP to B coccygeus  08/02/20: TTP to R OI.  08/09/20: TTP to all superficial layer PFM with some decreased mobility through the 2nd layer. TTP to L OI and B PC near tip of  coccyx.  TODAY: ~ 3/5 strength with poor recruitment from superficial layers. Decreased fascial mobility and pain with pressure near the posterior fourchette.   Abdominal:  Pt. Continues to demonstrate weak obliques recruitment and rib-flare but able to recruit ~ 25% pre treatment, improved with release to multifidus. (from previous session)  Pt. Unable to recruit Obliques with mini-marches pre-treatment.   **successful recruitment in all exercises practiced today following release. (from prior session)  Palpation: TTP to B diaphragm. (from prior session)  TODAY: TTP to B OI and Piriformis externally  Gait Analysis:  INTERVENTIONS THIS SESSION: Manual: Performed further TP release and STM to B OI and Piriformis externally followed by fascial/scar release to the posterior fourchette region to decrease spasm and pain and allow for improved balance of musculature for improved function and decreased symptoms.  Therex: educated on and practiced seated piriformis stretch To maintain and improve muscle length and allow for improved balance of musculature for long-term symptom relief.  Total time: 60 min.                               PT Short Term Goals - 07/13/20 0933      PT SHORT TERM GOAL #1   Title Patient will demonstrate improved pelvic alignment and balance of musculature surrounding the pelvis to facilitate decreased PFM spasms and decrease pelvic pain.    Baseline R up-slip and L anterior rotation. spasms surrounding B hips.    Time 6    Period Weeks    Status Achieved    Target Date 05/06/20      PT SHORT TERM GOAL #2   Title Pt. will demonstrate deep-core coordination and timing with functional motions including push, pull, squat, lunge and lifting motions to prevent recurrence of PFM spasms.    Baseline pt. has great difficulty coordinating her deep-core for mini-march exercise previously learned due to muscular imbalance surrounding pelvis.     Time 6    Period Weeks    Status Achieved    Target Date 05/06/20      PT SHORT TERM GOAL #3   Title Patient will demonstrate full HEP x1 IND in the clinic with appropriate performance to demonstrate understanding and proper form to allow for further improvement.    Baseline Pt. has history of poor follow-through with HEP leading to Sx. recurrence.    Time 6    Period Weeks    Status Partially Met    Target Date 05/06/20             PT Long Term Goals - 07/13/20 0933      PT LONG TERM GOAL #1   Title Patient will report no pain with intercourse to demonstrate improved functional ability.    Baseline Pain both with initial penetration and with deeper thrusting    Time 12    Period Weeks    Status On-going    Target Date 09/07/20  PT LONG TERM GOAL #2   Title Patient will describe pain no greater than 2/10 during 30-45 min of moderate-intensity exercise to demonstrate improved functional ability.    Baseline Having pain as high as 10/10 when bending forward over the last 3 weeks, occasiaonal "cramping" pain in the pelvis and ache in BLE for ~ 3 months.    Time 12    Period Weeks    Status Achieved    Target Date 09/07/20      PT LONG TERM GOAL #3   Title Pt. will improve in FOTO score by 10 points to demonstrate improved function.    Baseline FOTO PFDI pain: 21% As of 5/5: 25%, 12/9: PFDI Pain: 33, Bowel Cnst: 60, Bowel Leakage: 55, PFDI Bowel: 38    Time 12    Period Weeks    Status On-going    Target Date 09/07/20      PT LONG TERM GOAL #4   Title Pt. will demonstrate ability to maintain decreased Sx across 2 weeks using stress management techniques, diet, and HEP independently to demonstrate improved function and condition management strategies.    Baseline Pt. seeks care when in school and at home but continues to have interruptions to her care and is not yet IND with symptom management strategies.    Time 12    Period Weeks    Status On-going    Target Date  09/07/20                 Plan - 08/16/20 1230    Clinical Impression Statement Pt. Responded well to all interventions today, demonstrating improved fascial mobility and decreased spasm and pain as well as understanding and correct performance of all education and exercises provided today. They will continue to benefit from skilled physical therapy to work toward remaining goals and maximize function as well as decrease likelihood of symptom increase or recurrence.    PT Next Visit Plan Re-assess goalsm review deep-core coordination and emphasize obliques and glute recruitment, review hip-flexor stretches, re-assess PFM and further internal release to B coccygeus and OI?, attempt MWM to mobilize coccyx. D/C    PT Home Exercise Plan Mini-marches with green band for oblique recruitment, cat-cow, side-stretch, prone hip EXT over pillow, Child's pose, frog stretch, mini-marches, planks, boat pose, piriformis stretch.    Consulted and Agree with Plan of Care Patient           Patient will benefit from skilled therapeutic intervention in order to improve the following deficits and impairments:     Visit Diagnosis: Other muscle spasm  Leg length discrepancy  Abnormal posture  Other idiopathic scoliosis, thoracic region     Problem List Patient Active Problem List   Diagnosis Date Noted  . Anxiety 02/21/2017   Willa Rough DPT, ATC Willa Rough 08/16/2020, 12:32 PM  [place] MAIN [facility] SERVICES [address] [place], [place], [address] Phone: [phone]   Fax:  [phone]  Name: Judy Williamson MRN: [id number] Date of Birth: 2000-08-09

## 2020-08-23 ENCOUNTER — Other Ambulatory Visit: Payer: Self-pay

## 2020-08-23 ENCOUNTER — Ambulatory Visit: Payer: 59

## 2020-08-23 DIAGNOSIS — M62838 Other muscle spasm: Secondary | ICD-10-CM

## 2020-08-23 DIAGNOSIS — M2170 Unequal limb length (acquired), unspecified site: Secondary | ICD-10-CM

## 2020-08-23 DIAGNOSIS — M4124 Other idiopathic scoliosis, thoracic region: Secondary | ICD-10-CM

## 2020-08-23 DIAGNOSIS — R293 Abnormal posture: Secondary | ICD-10-CM

## 2020-08-23 NOTE — Therapy (Signed)
[place] MAIN [facility] SERVICES [address] [place], [place], [address] Phone: [phone]   Fax:  [phone]  Physical Therapy Treatment and Discharge Summary  The patient has been informed of current processes in place at Outpatient Rehab to protect patients from Covid-19 exposure including social distancing, schedule modifications, and new cleaning procedures. After discussing their particular risk with a therapist based on the patient's personal risk factors, the patient has decided to proceed with in-person therapy.  Patient Details  Name: Judy Williamson MRN: [id number] Date of Birth: [birth year] No data recorded  Encounter Date: 08/23/2020   PT End of Session - 08/23/20 1522    Visit Number 18    Number of Visits 20    Date for PT Re-Evaluation 09/07/20    Authorization Type [employer]    Authorization Time Period 07/12/20 through 09/07/20    Authorization - Visit Number 18    Authorization - Number of Visits 20    Progress Note Due on Visit 20    PT Start Time 1238    PT Stop Time 3009    PT Time Calculation (min) 60 min    Activity Tolerance Patient tolerated treatment well;No increased pain    Behavior During Therapy WFL for tasks assessed/performed           Past Medical History:  Diagnosis Date  . Anxiety   . IBS (irritable bowel syndrome)     Past Surgical History:  Procedure Laterality Date  . BREAST REDUCTION SURGERY Bilateral 09/2018  . HYMENECTOMY  11/2016    There were no vitals filed for this visit.  Pelvic Floor Physical Therapy Treatment Note  SCREENING  Changes in medications, allergies, or medical history?: none    SUBJECTIVE  Patient reports: She is doing well, she has not had any pain. She has dilated one time this week and has not really done her stretches.  Precautions:  Anxiety, prior hymenectomy, LLD  Pain update:  Location of pain: LB (R knee) Current pain:  0/10 (0/10) Max pain:  0/10  (0/10) Least pain:  0/10 (0/10) Nature of pain: achy  ** no increased pain following treatment.  Patient Goals: To work on more internal release so she can comfortably have intercourse and reduce her back, pelvic, and LE pain. She finds it hard to sit still for long but is not comfortable with one leg crossed over.   OBJECTIVE  Changes in: Posture/Observations:  PSIS appear level in standing and supine. Hyperlordotic, rib-flare. (from prior session)  06/21/20: Rib-flare and R out-flare in standing **Following treatment, Pt. Able to consistently recruit obliques with mini-marches to help improve deep-core coordination.  08/02/20: ASIS and PSIS level but R patella lateral and R facing pelvis.  Range of Motion/Flexibilty:  Decreased upper thoracic spinal mobility (from prior session)  Decreased hip IR with pain in adductors/groin.  Strength/MMT:  LE MMT:    Pelvic floor: 05/11/20: Not fully assessed today but Pt. Was able to tolerate digital insertion to deep layer of PFM and high amounts of tension palpated near posterior fourchette. Pt. Was able to tolerate TP release to allow for full relaxation through the posterior fourchette and following release was able to demonstrate coordinated squeeze and relax with VC. Strength ~ 3/5.  05/17/20: TTP to B PR/PC posteriorly  05/24/20: improved ease with initial insertion and faster/more comp,ete release. TTP to  B IC and PR/PC posteriorly. 3+/5 strength with greater recruitment from deeper fibers following release.  2/21: TTP to B  STP externally  07/12/20: TTP to B PR/PC anteriorly and IC on L. Greatest TTP at IC which re-created crampyness.  07/19/20: TTP to IC on R and B OI   07/26/20: TTP to B coccygeus  08/02/20: TTP to R OI.  08/09/20: TTP to all superficial layer PFM with some decreased mobility through the 2nd layer. TTP to L OI and B PC near tip of coccyx.  08/16/20: ~ 3/5 strength with poor recruitment from superficial layers.  Decreased fascial mobility and pain with pressure near the posterior fourchette.   TODAY: TTP greatly to L posterior PR/PC and IC and mildly to all on R and PR/PC anteriorly, coccygeus and OI on L  Abdominal:  Pt. Continues to demonstrate weak obliques recruitment and rib-flare but able to recruit ~ 25% pre treatment, improved with release to multifidus. (from previous session)  Pt. Unable to recruit Obliques with mini-marches pre-treatment.  **successful recruitment in all exercises practiced today following release. (from prior session)  Palpation: TTP to B diaphragm. (from prior session)  08/16/20: TTP to B OI and Piriformis externally  Gait Analysis:  INTERVENTIONS THIS SESSION: Manual: Performed further TP release and STM to L posterior PR/PC and IC and all on R internally to decrease spasm and pain and allow for improved balance of musculature for improved function and decreased symptoms.  Self-care: Reviewed progress and POC to D/C due to Pt. Moving to [place]. Encouraged Pt. To use tool to release remaining spasms on L and seek help for further deep-core and postural strengthening to prevent return of pain/spasms. Educated on positioning for intercourse that allows her to control depth and rate of penetration to decrease fear/pain reaction.  Total time: 60 min.                                PT Short Term Goals - 08/23/20 1239      PT SHORT TERM GOAL #1   Title Patient will demonstrate improved pelvic alignment and balance of musculature surrounding the pelvis to facilitate decreased PFM spasms and decrease pelvic pain.    Baseline R up-slip and L anterior rotation. spasms surrounding B hips.    Time 6    Period Weeks    Status Achieved    Target Date 05/06/20      PT SHORT TERM GOAL #2   Title Pt. will demonstrate deep-core coordination and timing with functional motions including push, pull, squat, lunge and lifting motions to prevent recurrence  of PFM spasms.    Baseline pt. has great difficulty coordinating her deep-core for mini-march exercise previously learned due to muscular imbalance surrounding pelvis.    Time 6    Period Weeks    Status Achieved    Target Date 05/06/20      PT SHORT TERM GOAL #3   Title Patient will demonstrate full HEP x1 IND in the clinic with appropriate performance to demonstrate understanding and proper form to allow for further improvement.    Baseline Pt. has history of poor follow-through with HEP leading to Sx. recurrence.    Time 6    Period Weeks    Status Achieved    Target Date 05/06/20             PT Long Term Goals - 08/23/20 1239      PT LONG TERM GOAL #1   Title Patient will report no pain with intercourse to demonstrate improved functional ability.  Baseline Pain both with initial penetration and with deeper thrusting    Time 12    Period Weeks    Status Unable to assess    Target Date 09/07/20      PT LONG TERM GOAL #2   Title Patient will describe pain no greater than 2/10 during 30-45 min of moderate-intensity exercise to demonstrate improved functional ability.    Baseline Having pain as high as 10/10 when bending forward over the last 3 weeks, occasiaonal "cramping" pain in the pelvis and ache in BLE for ~ 3 months.    Time 12    Period Weeks    Status Achieved    Target Date 09/07/20      PT LONG TERM GOAL #3   Title Pt. will improve in FOTO score by 10 points to demonstrate improved function.    Baseline FOTO PFDI pain: 21% As of 5/5: 25%, 12/9: PFDI Pain: 33, Bowel Cnst: 60, Bowel Leakage: 55, PFDI Bowel: 38. PFDI Pain: 8, Bowel Cnst:68, Bowel Leakage: 57, PFDI Bowel: 4    Time 12    Period Weeks    Status Partially Met    Target Date 09/07/20      PT LONG TERM GOAL #4   Title Pt. will demonstrate ability to maintain decreased Sx across 2 weeks using stress management techniques, diet, and HEP independently to demonstrate improved function and condition  management strategies.    Baseline Pt. seeks care when in school and at home but continues to have interruptions to her care and is not yet IND with symptom management strategies.    Time 12    Period Weeks    Status Achieved    Target Date 09/07/20                 Plan - 08/23/20 1530    Clinical Impression Statement Pt. has met or made significant progress toward all goals with some PFM spasms remaining but no attempts at intercourse to determine whether this will cause pain. She would benefit from some further treatment but [place] so we will D/C at this time to allow her to find support in her new home.    Personal Factors and Comorbidities Comorbidity 2    Comorbidities Anxiety, IBD    Examination-Participation Restrictions Interpersonal Relationship;Other    Stability/Clinical Decision Making Evolving/Moderate complexity    Rehab Potential Good    PT Treatment/Interventions ADLs/Self Care Home Management;Biofeedback;Electrical Stimulation;Moist Heat;Traction;Ultrasound;Functional mobility training;Gait training;Therapeutic activities;Therapeutic exercise;Balance training;Neuromuscular re-education;Orthotic Fit/Training;Patient/family education;Passive range of motion;Dry needling;Spinal Manipulations;Joint Manipulations    PT Next Visit Plan D/C    PT Home Exercise Plan Mini-marches with green band for oblique recruitment, cat-cow, side-stretch, prone hip EXT over pillow, Child's pose, frog stretch, mini-marches, planks, boat pose, piriformis stretch.    Consulted and Agree with Plan of Care Patient           Patient will benefit from skilled therapeutic intervention in order to improve the following deficits and impairments:  Increased muscle spasms,Improper body mechanics,Decreased activity tolerance,Decreased coordination,Decreased strength,Increased fascial restricitons,Impaired flexibility,Pain,Postural dysfunction  Visit Diagnosis: Other muscle  spasm  Leg length discrepancy  Abnormal posture  Other idiopathic scoliosis, thoracic region     Problem List Patient Active Problem List   Diagnosis Date Noted  . Anxiety 02/21/2017   Willa Rough DPT, ATC Willa Rough 08/24/2020, 12:30 PM  [place] MAIN [facility] SERVICES [address] [place], [place], [address] Phone: [phone]  Fax:  [phone]  Name: Judy Williamson MRN: [id number] Date of Birth: March 05, 2001

## 2020-12-04 IMAGING — CR DG ABDOMEN 2V
3 series · 3 of 3 positions shown · non-contrast
Comparison: No prior.

CLINICAL DATA: Abdominal cramping.

EXAM:
ABDOMEN - 2 VIEW

[abdomen erect]
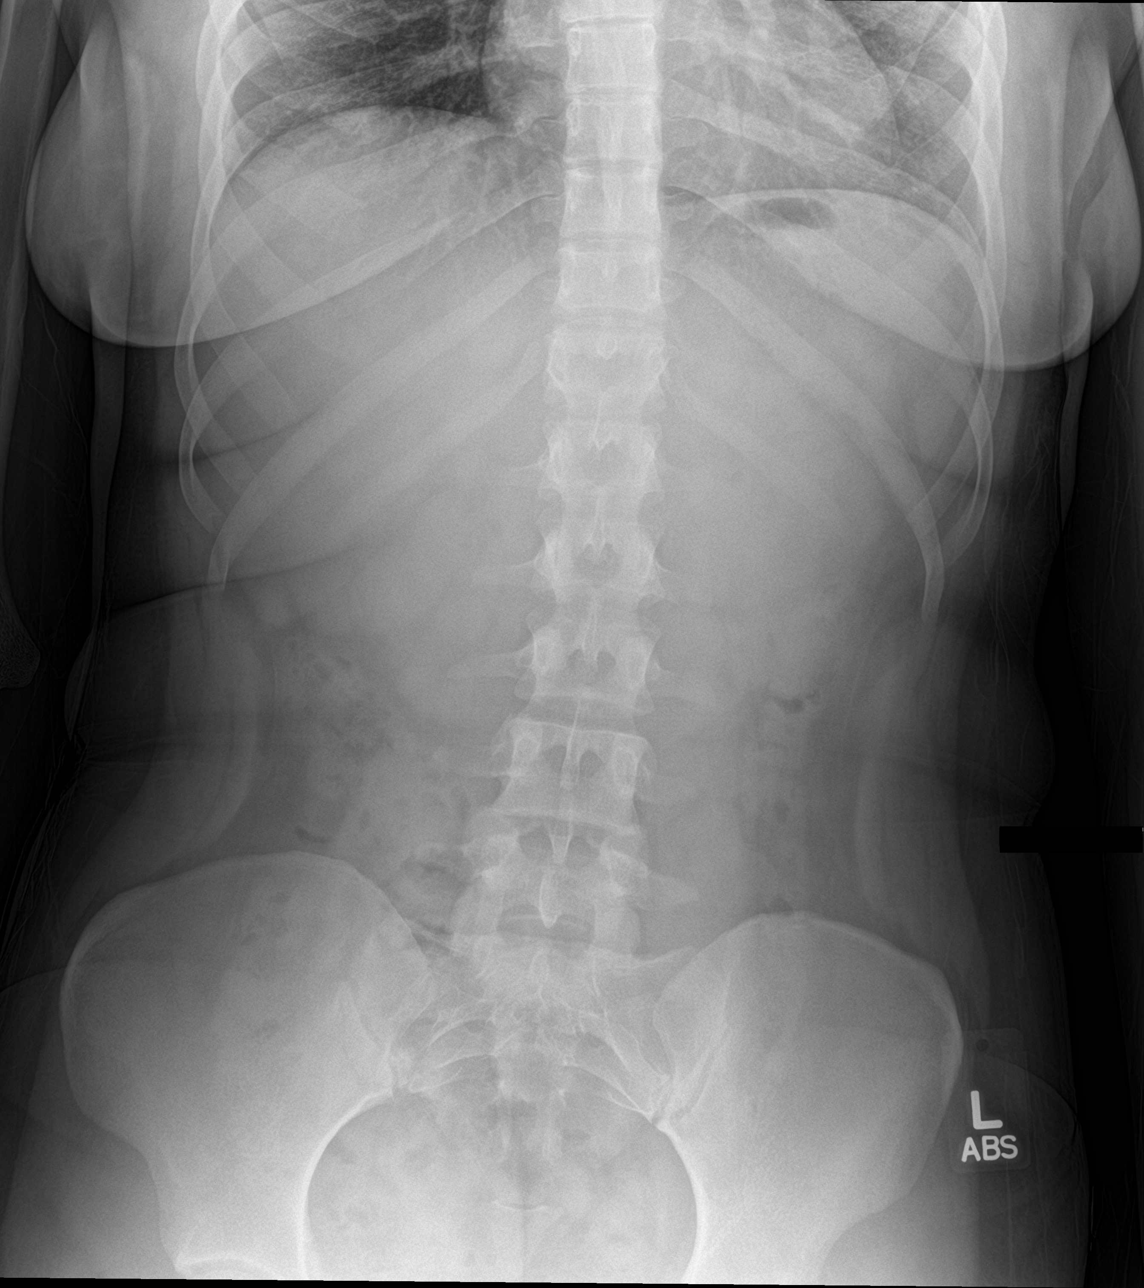

[abdomen supine (1 of 2)]
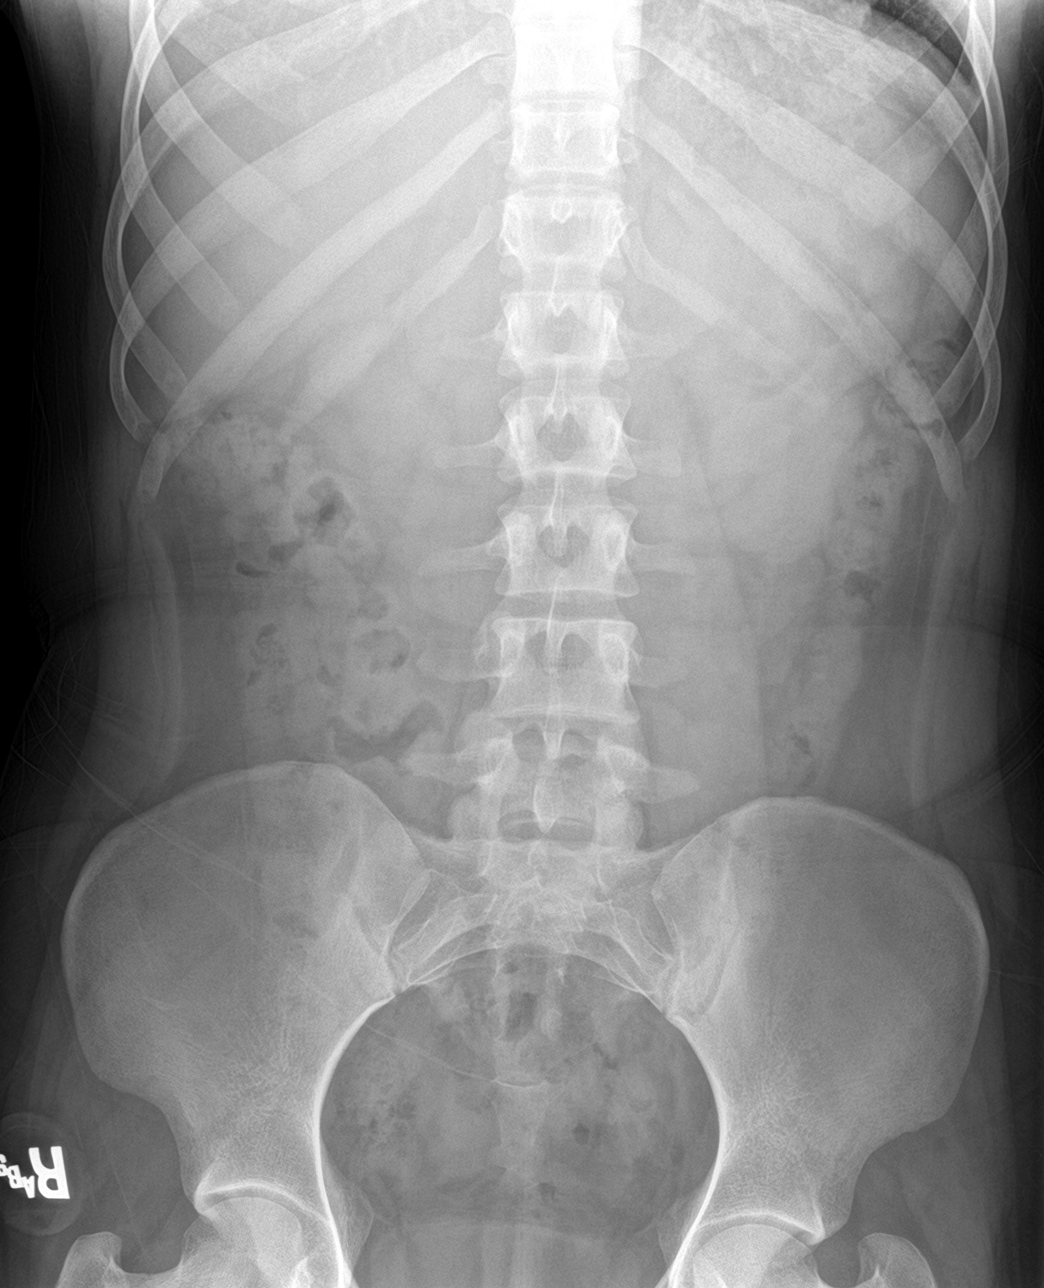

[abdomen supine (2 of 2)]
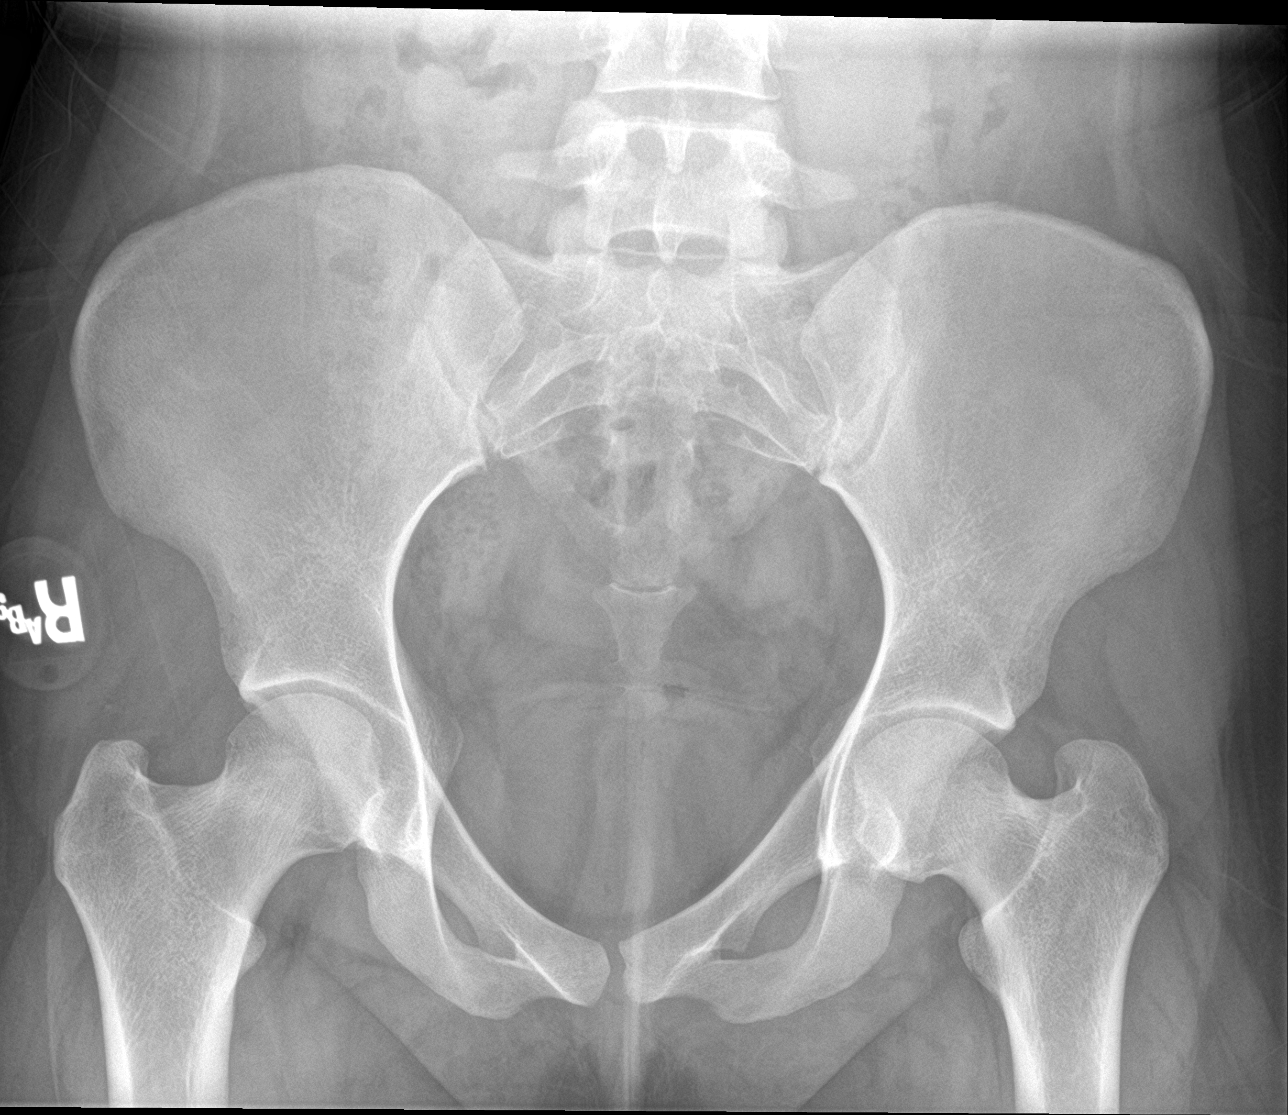

[3 of 3 positions shown; findings below may reference images not displayed]

FINDINGS: Soft tissue structures are unremarkable. No bowel distention or free
air. Stool noted throughout the colon. No acute bony abnormality.
IMPRESSION: No acute abnormality identified. No bowel distention or free air.
Stool noted throughout the colon.

## 2021-12-22 IMAGING — CR DG ABDOMEN 1V
1 series · 2 of 2 positions shown · non-contrast
Comparison: Radiograph 05/23/2019

CLINICAL DATA: Incontinence of feces. Cramping. Lower abdominal
pain.

EXAM:
ABDOMEN - 1 VIEW

[Series 1: dg abd 1 view · 0.14mm/px · 2 of 2 slices shown]
[im 1/2]
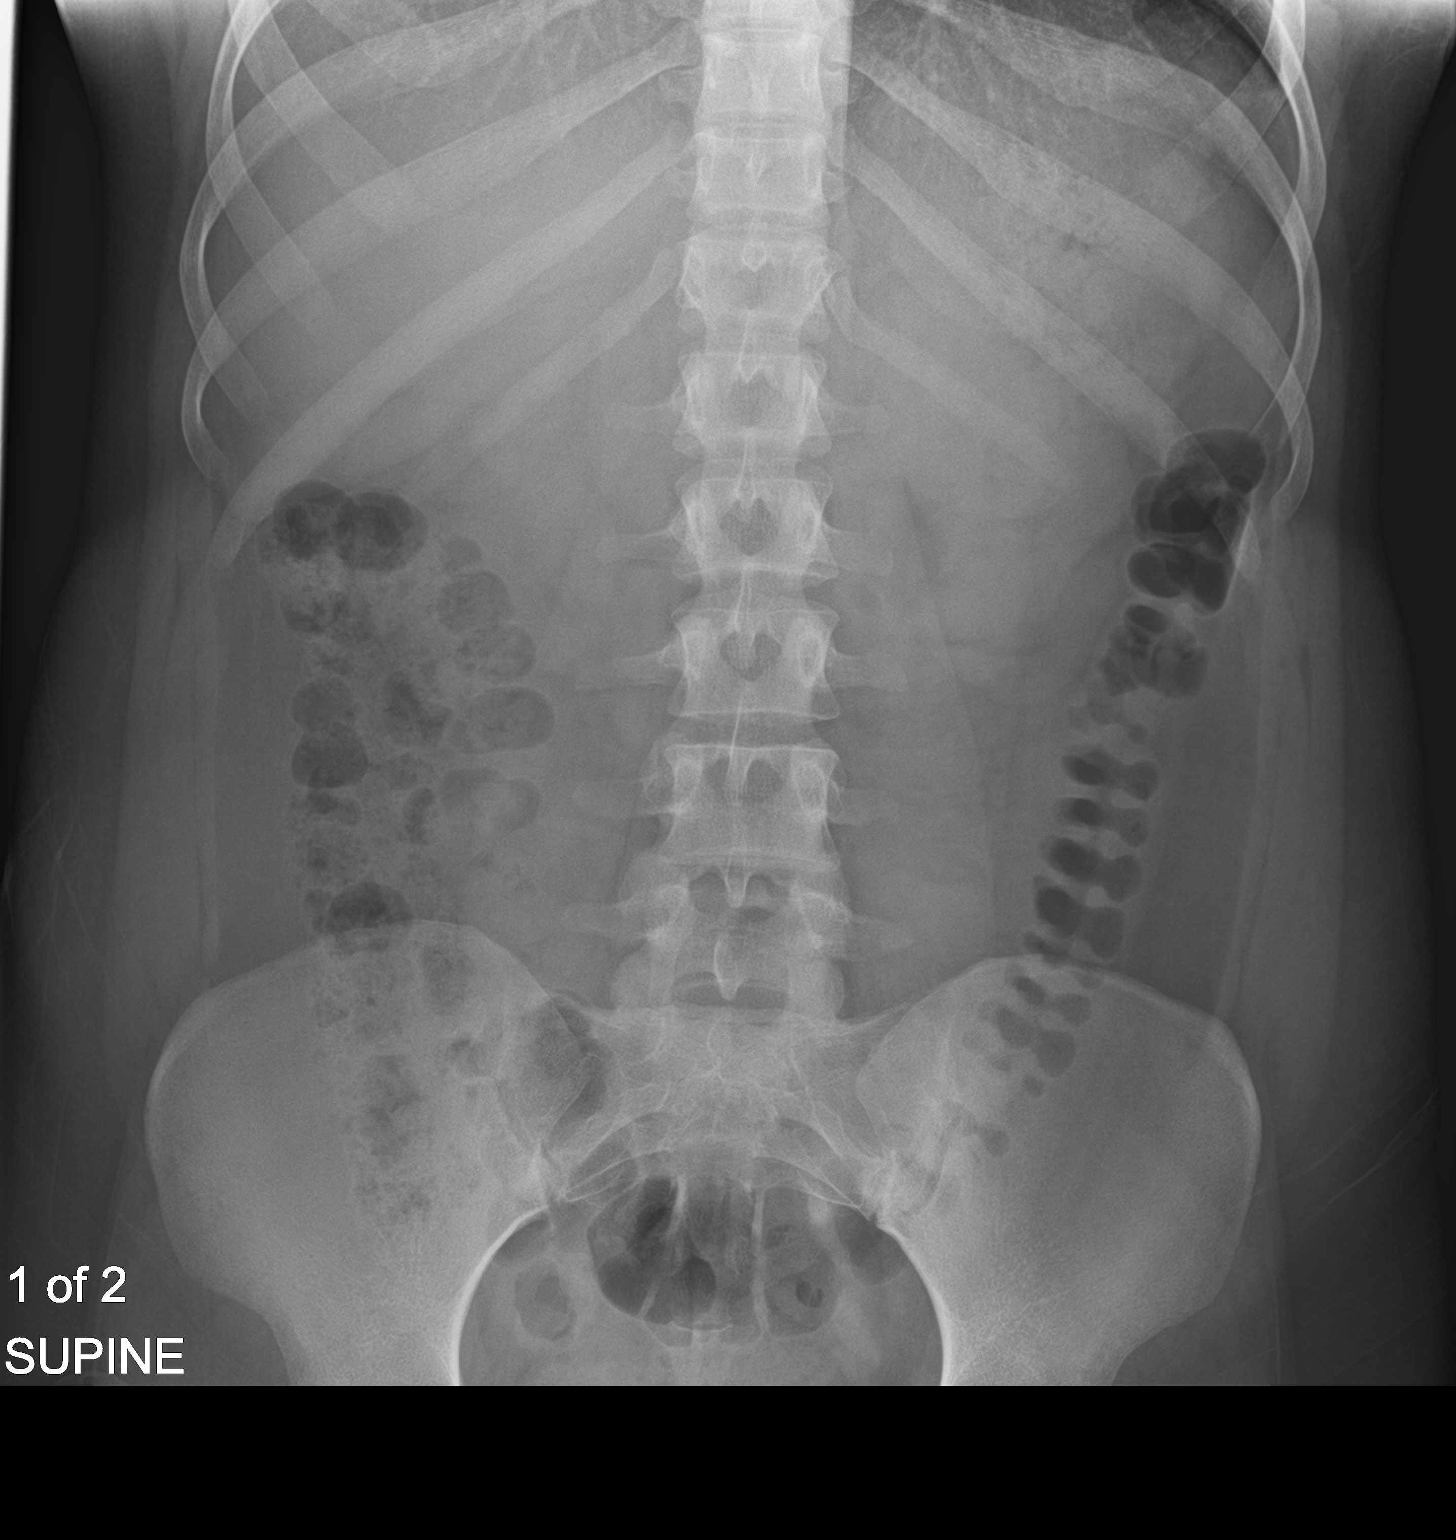
[im 2/2]
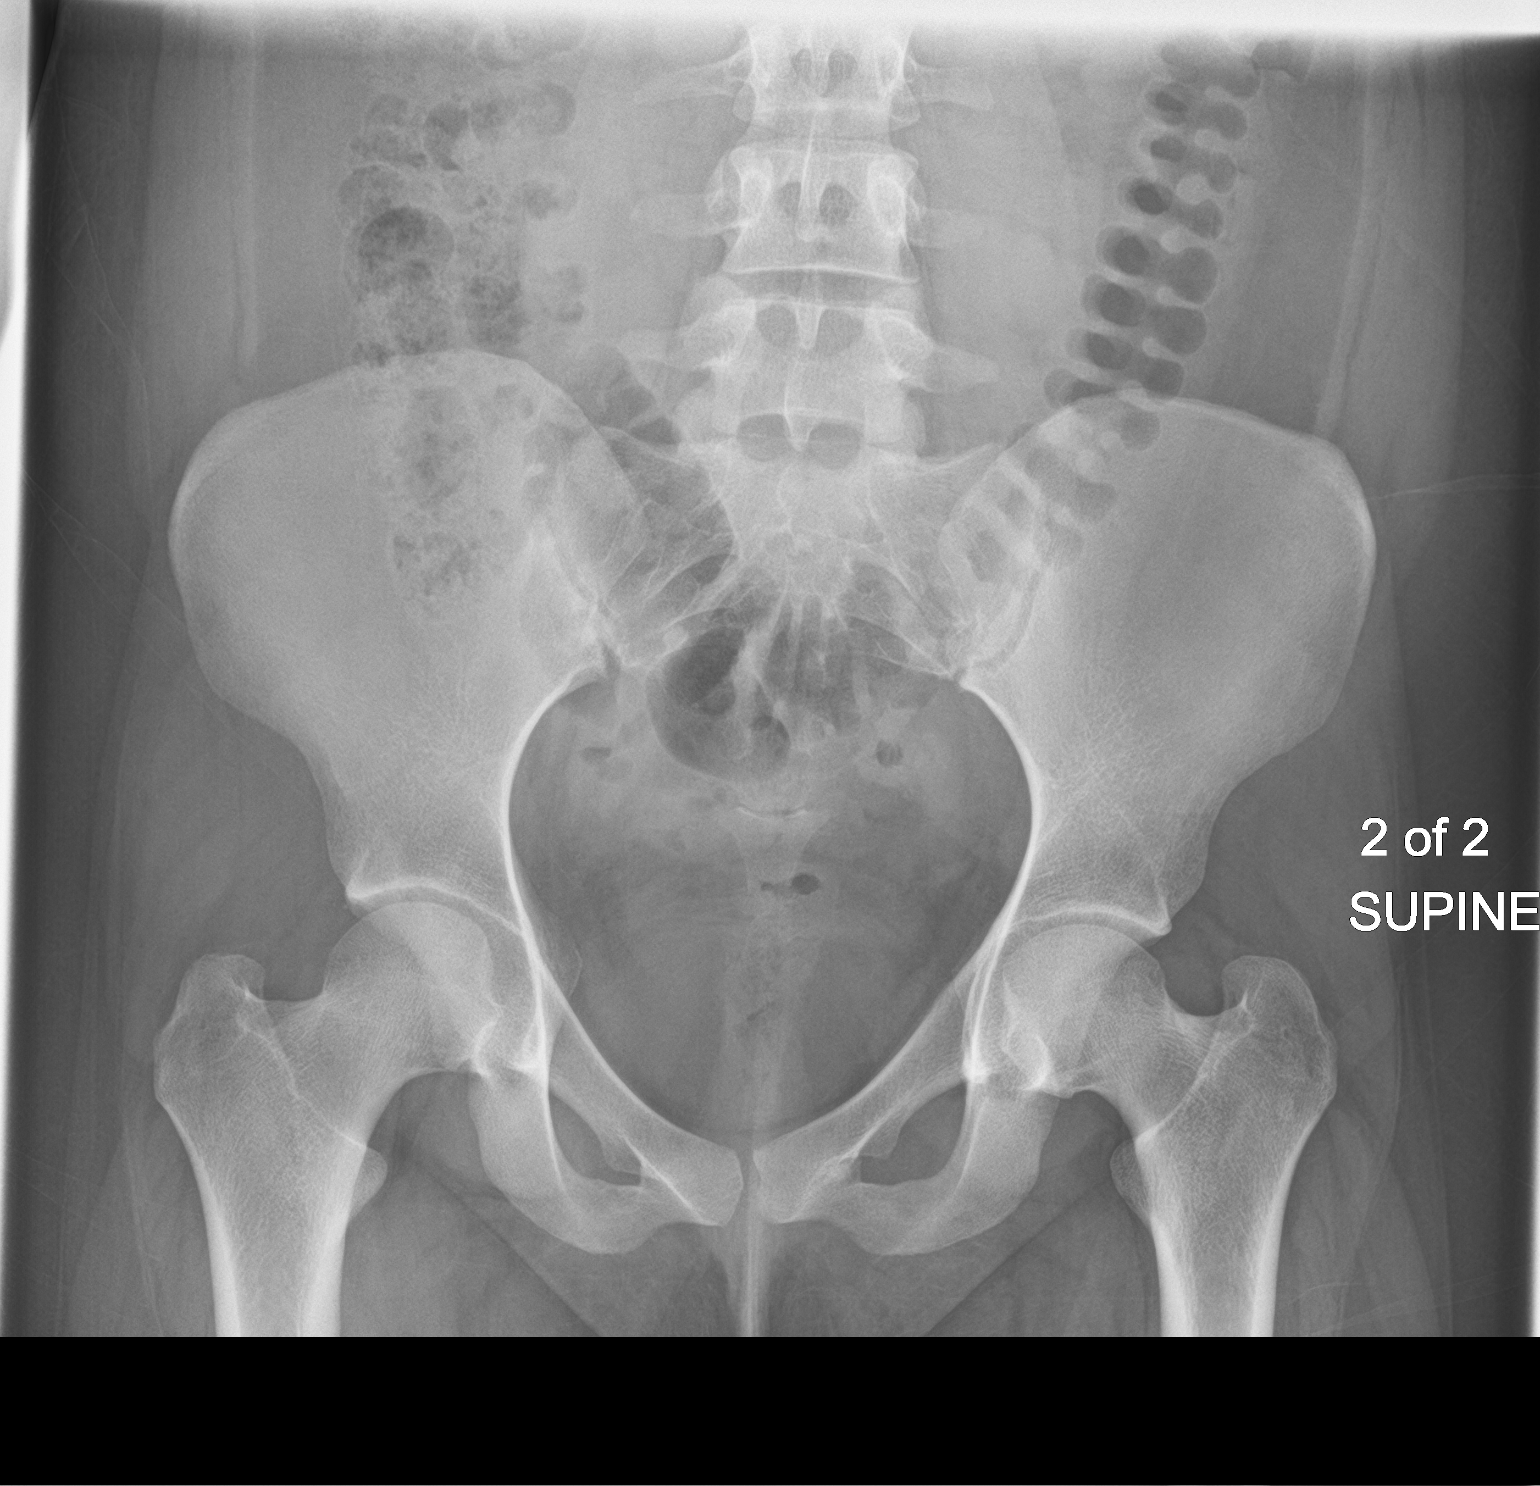

[2 of 2 positions shown; findings below may reference images not displayed]

FINDINGS: Divided supine views of the abdomen. Moderate stool in the right
colon. Air-filled transverse colon without abnormal distension.
Small volume of stool in the rectum. No small bowel dilatation or
evidence of obstruction. No concerning intraabdominal mass effect.
No radiopaque calculi or abnormal soft tissue calcifications. No
osseous abnormalities are seen.
IMPRESSION: Normal bowel gas pattern.
